# Patient Record
Sex: Male | Born: 1950 | Race: White | Hispanic: No | Marital: Married | Smoking: Former smoker
Health system: Southern US, Community
[De-identification: ages and names within clinical notes are randomized; demographics above are authoritative.]

## PROBLEM LIST (undated history)

## (undated) DIAGNOSIS — E785 Hyperlipidemia, unspecified: Secondary | ICD-10-CM

## (undated) DIAGNOSIS — I10 Essential (primary) hypertension: Secondary | ICD-10-CM

## (undated) HISTORY — DX: Essential (primary) hypertension: I10

## (undated) HISTORY — DX: Hyperlipidemia, unspecified: E78.5

## (undated) HISTORY — PX: APPENDECTOMY: SHX54

## (undated) HISTORY — PX: COLONOSCOPY: SHX174

---

## 2004-06-16 ENCOUNTER — Ambulatory Visit: Payer: Self-pay | Admitting: Neurological Surgery

## 2004-06-16 ENCOUNTER — Inpatient Hospital Stay (HOSPITAL_COMMUNITY): Admission: EM | Admit: 2004-06-16 | Discharge: 2004-06-29 | Payer: Self-pay | Admitting: Emergency Medicine

## 2004-07-07 ENCOUNTER — Ambulatory Visit (HOSPITAL_COMMUNITY): Admission: RE | Admit: 2004-07-07 | Discharge: 2004-07-07 | Payer: Self-pay | Admitting: Neurological Surgery

## 2004-07-21 ENCOUNTER — Ambulatory Visit: Payer: Self-pay | Admitting: Internal Medicine

## 2005-04-28 ENCOUNTER — Encounter: Admission: RE | Admit: 2005-04-28 | Discharge: 2005-04-28 | Payer: Self-pay | Admitting: Neurology

## 2006-06-08 ENCOUNTER — Ambulatory Visit: Payer: Self-pay | Admitting: Internal Medicine

## 2006-08-03 HISTORY — PX: LAPAROSCOPIC RETROPUBIC PROSTATECTOMY: SUR794

## 2007-12-29 ENCOUNTER — Ambulatory Visit: Payer: Self-pay | Admitting: Internal Medicine

## 2007-12-29 DIAGNOSIS — I10 Essential (primary) hypertension: Secondary | ICD-10-CM | POA: Insufficient documentation

## 2007-12-29 DIAGNOSIS — E7800 Pure hypercholesterolemia, unspecified: Secondary | ICD-10-CM | POA: Insufficient documentation

## 2007-12-29 DIAGNOSIS — E785 Hyperlipidemia, unspecified: Secondary | ICD-10-CM | POA: Insufficient documentation

## 2007-12-29 DIAGNOSIS — H9319 Tinnitus, unspecified ear: Secondary | ICD-10-CM | POA: Insufficient documentation

## 2007-12-29 DIAGNOSIS — K219 Gastro-esophageal reflux disease without esophagitis: Secondary | ICD-10-CM | POA: Insufficient documentation

## 2008-01-02 ENCOUNTER — Encounter (INDEPENDENT_AMBULATORY_CARE_PROVIDER_SITE_OTHER): Payer: Self-pay | Admitting: *Deleted

## 2008-06-13 ENCOUNTER — Encounter: Payer: Self-pay | Admitting: Internal Medicine

## 2008-08-01 ENCOUNTER — Encounter (INDEPENDENT_AMBULATORY_CARE_PROVIDER_SITE_OTHER): Payer: Self-pay | Admitting: Urology

## 2008-08-01 ENCOUNTER — Inpatient Hospital Stay (HOSPITAL_COMMUNITY): Admission: RE | Admit: 2008-08-01 | Discharge: 2008-08-02 | Payer: Self-pay | Admitting: Urology

## 2008-08-08 ENCOUNTER — Encounter: Payer: Self-pay | Admitting: Internal Medicine

## 2008-09-19 ENCOUNTER — Encounter: Payer: Self-pay | Admitting: Internal Medicine

## 2008-12-07 ENCOUNTER — Encounter: Payer: Self-pay | Admitting: Cardiology

## 2008-12-07 ENCOUNTER — Ambulatory Visit: Payer: Self-pay

## 2008-12-07 ENCOUNTER — Ambulatory Visit: Payer: Self-pay | Admitting: Cardiology

## 2008-12-13 ENCOUNTER — Telehealth (INDEPENDENT_AMBULATORY_CARE_PROVIDER_SITE_OTHER): Payer: Self-pay | Admitting: *Deleted

## 2008-12-17 LAB — CONVERTED CEMR LAB
BUN: 20 mg/dL (ref 6–23)
CO2: 26 meq/L (ref 19–32)
Calcium: 9.2 mg/dL (ref 8.4–10.5)
Chloride: 107 meq/L (ref 96–112)
Creatinine, Ser: 1 mg/dL (ref 0.4–1.5)
GFR calc non Af Amer: 82.07 mL/min (ref >60)
Glucose, Bld: 83 mg/dL (ref 70–99)
Magnesium: 2.5 mg/dL (ref 1.5–2.5)
Potassium: 3.8 meq/L (ref 3.5–5.1)
Sodium: 141 meq/L (ref 135–145)
TSH: 0.58 microintl units/mL (ref 0.35–5.50)

## 2008-12-19 DIAGNOSIS — I499 Cardiac arrhythmia, unspecified: Secondary | ICD-10-CM | POA: Insufficient documentation

## 2008-12-21 ENCOUNTER — Ambulatory Visit: Payer: Self-pay | Admitting: Cardiology

## 2009-04-26 ENCOUNTER — Encounter: Payer: Self-pay | Admitting: Internal Medicine

## 2009-05-08 ENCOUNTER — Telehealth (INDEPENDENT_AMBULATORY_CARE_PROVIDER_SITE_OTHER): Payer: Self-pay | Admitting: *Deleted

## 2009-06-26 ENCOUNTER — Ambulatory Visit: Payer: Self-pay | Admitting: Internal Medicine

## 2009-06-26 DIAGNOSIS — Z8546 Personal history of malignant neoplasm of prostate: Secondary | ICD-10-CM | POA: Insufficient documentation

## 2009-07-02 ENCOUNTER — Encounter (INDEPENDENT_AMBULATORY_CARE_PROVIDER_SITE_OTHER): Payer: Self-pay | Admitting: *Deleted

## 2009-11-22 ENCOUNTER — Encounter: Payer: Self-pay | Admitting: Internal Medicine

## 2010-01-10 ENCOUNTER — Telehealth: Payer: Self-pay | Admitting: Internal Medicine

## 2010-02-18 ENCOUNTER — Encounter: Payer: Self-pay | Admitting: Internal Medicine

## 2010-08-06 ENCOUNTER — Other Ambulatory Visit: Payer: Self-pay | Admitting: Internal Medicine

## 2010-08-06 ENCOUNTER — Ambulatory Visit
Admission: RE | Admit: 2010-08-06 | Discharge: 2010-08-06 | Payer: Self-pay | Source: Home / Self Care | Attending: Internal Medicine | Admitting: Internal Medicine

## 2010-08-06 ENCOUNTER — Encounter: Payer: Self-pay | Admitting: Internal Medicine

## 2010-08-06 LAB — CBC WITH DIFFERENTIAL/PLATELET
Basophils Absolute: 0.1 10*3/uL (ref 0.0–0.1)
Basophils Relative: 0.8 % (ref 0.0–3.0)
Eosinophils Absolute: 0.3 10*3/uL (ref 0.0–0.7)
Eosinophils Relative: 4.1 % (ref 0.0–5.0)
HCT: 40.3 % (ref 39.0–52.0)
Hemoglobin: 13.6 g/dL (ref 13.0–17.0)
Lymphocytes Relative: 27.8 % (ref 12.0–46.0)
Lymphs Abs: 1.9 10*3/uL (ref 0.7–4.0)
MCHC: 33.7 g/dL (ref 30.0–36.0)
MCV: 97.8 fl (ref 78.0–100.0)
Monocytes Absolute: 0.6 10*3/uL (ref 0.1–1.0)
Monocytes Relative: 8.4 % (ref 3.0–12.0)
Neutro Abs: 4 10*3/uL (ref 1.4–7.7)
Neutrophils Relative %: 58.9 % (ref 43.0–77.0)
Platelets: 274 10*3/uL (ref 150.0–400.0)
RBC: 4.12 Mil/uL — ABNORMAL LOW (ref 4.22–5.81)
RDW: 13.6 % (ref 11.5–14.6)
WBC: 6.8 10*3/uL (ref 4.5–10.5)

## 2010-08-06 LAB — HEPATIC FUNCTION PANEL
ALT: 59 U/L — ABNORMAL HIGH (ref 0–53)
AST: 56 U/L — ABNORMAL HIGH (ref 0–37)
Albumin: 4.3 g/dL (ref 3.5–5.2)
Alkaline Phosphatase: 50 U/L (ref 39–117)
Bilirubin, Direct: 0.1 mg/dL (ref 0.0–0.3)
Total Bilirubin: 0.8 mg/dL (ref 0.3–1.2)
Total Protein: 7.6 g/dL (ref 6.0–8.3)

## 2010-08-06 LAB — BASIC METABOLIC PANEL
BUN: 20 mg/dL (ref 6–23)
CO2: 26 mEq/L (ref 19–32)
Calcium: 9.8 mg/dL (ref 8.4–10.5)
Chloride: 107 mEq/L (ref 96–112)
Creatinine, Ser: 0.9 mg/dL (ref 0.4–1.5)
GFR: 97.1 mL/min (ref >60.00)
Glucose, Bld: 86 mg/dL (ref 70–99)
Potassium: 4.4 mEq/L (ref 3.5–5.1)
Sodium: 140 mEq/L (ref 135–145)

## 2010-08-06 LAB — TSH: TSH: 0.62 u[IU]/mL (ref 0.35–5.50)

## 2010-08-31 LAB — CONVERTED CEMR LAB
ALT: 45 units/L (ref 0–53)
ALT: 51 units/L (ref 0–53)
AST: 39 units/L — ABNORMAL HIGH (ref 0–37)
AST: 45 units/L — ABNORMAL HIGH (ref 0–37)
Albumin: 4.2 g/dL (ref 3.5–5.2)
Albumin: 4.5 g/dL (ref 3.5–5.2)
Alkaline Phosphatase: 44 units/L (ref 39–117)
Alkaline Phosphatase: 58 units/L (ref 39–117)
BUN: 23 mg/dL (ref 6–23)
BUN: 25 mg/dL — ABNORMAL HIGH (ref 6–23)
Basophils Absolute: 0 10*3/uL (ref 0.0–0.1)
Basophils Absolute: 0.1 10*3/uL (ref 0.0–0.1)
Basophils Relative: 0.8 % (ref 0.0–1.0)
Basophils Relative: 0.8 % (ref 0.0–3.0)
Bilirubin, Direct: 0.1 mg/dL (ref 0.0–0.3)
Bilirubin, Direct: 0.1 mg/dL (ref 0.0–0.3)
CO2: 29 meq/L (ref 19–32)
CO2: 29 meq/L (ref 19–32)
Calcium: 9.5 mg/dL (ref 8.4–10.5)
Calcium: 9.9 mg/dL (ref 8.4–10.5)
Chloride: 101 meq/L (ref 96–112)
Chloride: 105 meq/L (ref 96–112)
Cholesterol, target level: 200 mg/dL
Cholesterol: 253 mg/dL (ref 0–200)
Creatinine, Ser: 1 mg/dL (ref 0.4–1.5)
Creatinine, Ser: 1 mg/dL (ref 0.4–1.5)
Direct LDL: 161.2 mg/dL
Eosinophils Absolute: 0.1 10*3/uL (ref 0.0–0.7)
Eosinophils Absolute: 0.3 10*3/uL (ref 0.0–0.7)
Eosinophils Relative: 2.3 % (ref 0.0–5.0)
Eosinophils Relative: 4.5 % (ref 0.0–5.0)
Free T4: 0.7 ng/dL (ref 0.6–1.6)
GFR calc Af Amer: 100 mL/min
GFR calc non Af Amer: 81.91 mL/min (ref >60)
GFR calc non Af Amer: 82 mL/min
Glucose, Bld: 102 mg/dL — ABNORMAL HIGH (ref 70–99)
Glucose, Bld: 102 mg/dL — ABNORMAL HIGH (ref 70–99)
HCT: 39.4 % (ref 39.0–52.0)
HCT: 42.6 % (ref 39.0–52.0)
HDL goal, serum: 40 mg/dL
HDL: 70.8 mg/dL (ref >39.0)
Hemoglobin: 13.5 g/dL (ref 13.0–17.0)
Hemoglobin: 14.3 g/dL (ref 13.0–17.0)
Hgb A1c MFr Bld: 5.4 % (ref 4.6–6.5)
LDL Goal: 150 mg/dL
Lymphocytes Relative: 28.7 % (ref 12.0–46.0)
Lymphocytes Relative: 30.4 % (ref 12.0–46.0)
Lymphs Abs: 1.6 10*3/uL (ref 0.7–4.0)
MCHC: 33.7 g/dL (ref 30.0–36.0)
MCHC: 34.2 g/dL (ref 30.0–36.0)
MCV: 95.4 fL (ref 78.0–100.0)
MCV: 99.3 fL (ref 78.0–100.0)
Monocytes Absolute: 0.5 10*3/uL (ref 0.1–1.0)
Monocytes Absolute: 0.6 10*3/uL (ref 0.1–1.0)
Monocytes Relative: 8.9 % (ref 3.0–12.0)
Monocytes Relative: 9.7 % (ref 3.0–12.0)
Neutro Abs: 3.2 10*3/uL (ref 1.4–7.7)
Neutro Abs: 3.7 10*3/uL (ref 1.4–7.7)
Neutrophils Relative %: 56.8 % (ref 43.0–77.0)
Neutrophils Relative %: 57.1 % (ref 43.0–77.0)
PSA: 4.2 ng/mL — ABNORMAL HIGH (ref 0.10–4.00)
Platelets: 258 10*3/uL (ref 150.0–400.0)
Platelets: 287 10*3/uL (ref 150–400)
Potassium: 3.9 meq/L (ref 3.5–5.1)
Potassium: 4 meq/L (ref 3.5–5.1)
RBC: 3.97 M/uL — ABNORMAL LOW (ref 4.22–5.81)
RBC: 4.46 M/uL (ref 4.22–5.81)
RDW: 12.4 % (ref 11.5–14.6)
RDW: 12.4 % (ref 11.5–14.6)
Sodium: 140 meq/L (ref 135–145)
Sodium: 141 meq/L (ref 135–145)
TSH: 0.45 microintl units/mL (ref 0.35–5.50)
TSH: 0.57 microintl units/mL (ref 0.35–5.50)
Total Bilirubin: 1 mg/dL (ref 0.3–1.2)
Total Bilirubin: 1.2 mg/dL (ref 0.3–1.2)
Total CHOL/HDL Ratio: 3.6
Total Protein: 7.5 g/dL (ref 6.0–8.3)
Total Protein: 7.6 g/dL (ref 6.0–8.3)
Triglycerides: 51 mg/dL (ref 0–149)
VLDL: 10 mg/dL (ref 0–40)
WBC: 5.4 10*3/uL (ref 4.5–10.5)
WBC: 6.6 10*3/uL (ref 4.5–10.5)

## 2010-09-03 NOTE — Letter (Signed)
Summary: [facility]  [facility]   Imported By: Lanelle Bal 03/12/2010 08:16:14  _____________________________________________________________________  External Attachment:    Type:   Image     Comment:   External Document

## 2010-09-03 NOTE — Medication Information (Signed)
Summary: Nonadherence with Amlodipine Besylate/[facility]  Nonadherence with Amlodipine Besylate/[facility]   Imported By: Lanelle Bal 11/29/2009 13:07:29  _____________________________________________________________________  External Attachment:    Type:   Image     Comment:   External Document

## 2010-09-03 NOTE — Progress Notes (Signed)
Summary: FYI to dr hopper  Phone Note Call from Patient   Caller: Spouse Summary of Call: patient wife wanted dr hopper to know patient is having surgery on his shoulder [phone] Initial call taken by: Okey Regal [place],  January 10, 2010 2:06 PM  Follow-up for Phone Call        noted Follow-up by: Marga Melnick MD,  January 10, 2010 2:13 PM

## 2010-09-04 NOTE — Assessment & Plan Note (Signed)
Summary: CPX/FASTING//KN   Vital Signs:  Patient profile:   60 year old male Height:      70.5 inches Weight:      205 pounds BMI:     29.10 Temp:     98.6 degrees F oral Pulse rate:   88 / minute Resp:     14 per minute BP sitting:   150 / 96  (left arm) Cuff size:   large  Vitals Entered By: Shonna Chock CMA (August 06, 2010 9:33 AM) CC: CPX with fasting labs , Lipid Management Comments Patient's vaccines UTD/ Healthcare worker   Primary Care Provider:  Marga Melnick MD  CC:  CPX with fasting labs  and Lipid Management.  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     Hypertension Follow-Up: The patient denies lightheadedness, urinary frequency, headaches, edema, and fatigue.  The patient denies the following associated symptoms: chest pain, chest pressure, exercise intolerance, dyspnea, palpitations, and syncope.  Compliance with medications (by patient report) has been near 100%.  The patient reports that dietary compliance has been fair.  The patient reports exercising daily.  Adjunctive measures currently used by the patient include  modified salt restriction.  BP averages < 140/85. He increased  Amlopidine to 10 mg in 01/2010 because of BPs in 150s. 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     Hyperlipidemia Follow-Up:   The patient denies muscle aches, GI upset, abdominal pain, flushing, itching, constipation, and diarrhea.  Compliance with medications (by patient report) has been near 100%, but he decreased dose to 10 mg in 07/11 when CPK was up . He had torn biceps in addition to the rotator cuff  prior to enzyme check.  Adjunctive measures currently used by the patient include ASA and fish oil supplements.    Lipid Management History:      Positive NCEP/ATP III risk factors include male age 83 years old or older, current tobacco user,  and hypertension.  Negative NCEP/ATP III risk factors include non-diabetic, HDL cholesterol greater than 60, no family history for ischemic heart disease, no ASHD (atherosclerotic heart disease), no prior stroke/TIA, no peripheral vascular disease, and no history of aortic aneurysm.     Preventive Screening-Counseling & Management  Alcohol-Tobacco     Smoking Status: current  Current Medications (verified): 1)  Altace 10 Mg Caps (Ramipril) .Marland Kitchen.. 1 Cap Once Daily 2)  Hydrochlorothiazide 25 Mg  Tabs (Hydrochlorothiazide) .... 1/2 Tab Once Daily 3)  Sertraline Hcl 50 Mg  Tabs (Sertraline Hcl) .... Take One Tablet Daily 4)  Cialis 5 Mg Tabs (Tadalafil) .... As Needed 5)  Amlodipine Besylate 10 Mg Tabs (Amlodipine Besylate) .Marland Kitchen.. 1 By Mouth Once Daily 6)  Pravastatin Sodium 10 Mg Tabs (Pravastatin Sodium) .Marland Kitchen.. 1  At Bedtime  Allergies: 1)  ! 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 NMR Lipoprofile 06/2009: LDL 177( 1556/149), HDL 102, TG 54. LDL goal = < <140, ideally < 120. 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 dementia; PGF: alcoholism  Social History: Occupation: OB/GYN Married Current Smoker-cigars,rarely ( 1-2 cigars / weekend) Alcohol use-yes-socially Regular exercise-yes:CVE as treadmill  30-40 min  /day  Review of Systems  The patient denies anorexia, fever, vision loss, decreased hearing, prolonged cough, hemoptysis, melena, hematochezia, severe indigestion/heartburn, suspicious skin lesions, unusual weight change, abnormal bleeding, enlarged lymph nodes, and angioedema.         Weight down 15 # with low carb program.  Physical Exam  General:  Well-developed,well-nourished;alert,appropriate and cooperative throughout examination Head:  Normocephalic and atraumatic without obvious abnormalities.  Eyes:  No corneal or conjunctival inflammation noted.Perrla. Funduscopic exam benign, without hemorrhages, exudates or papilledema.  Ears:  External ear exam shows no significant lesions or deformities.  Otoscopic examination reveals clear canals, tympanic membranes are intact bilaterally without bulging, retraction, inflammation or discharge. Hearing is grossly normal bilaterally. Nose:  External nasal examination shows no deformity or inflammation. Nasal mucosa are pink and moist without lesions or exudates. Mouth:  Oral mucosa and oropharynx without lesions or exudates.  Teeth in good repair. Neck:  No deformities, masses, or tenderness noted. Minimal physiologic asymmetry , R > L thyroid lobe Chest Wall:  Well developed pec muscles Lungs:  Normal respiratory effort, chest expands symmetrically. Lungs are clear to auscultation, no crackles or wheezes. Heart:  Normal rate and regular rhythm. S1 and S2 normal without gallop, murmur, click, rub .Soft S4 Abdomen:  Bowel sounds positive,abdomen soft and non-tender without masses, organomegaly or hernias noted. Genitalia:  Dr Marcello Fennel Msk:  No deformity or scoliosis noted of thoracic or lumbar spine.   Pulses:  R and L carotid,radial,dorsalis pedis and posterior tibial pulses are full and equal bilaterally Extremities:  No clubbing, cyanosis, edema, or deformity noted with normal full range of motion of all joints.   Neurologic:  alert &  oriented X3, strength normal in all extremities, and DTRs symmetrical and normal.   Skin:  Intact without suspicious lesions or rashes Cervical Nodes:  No lymphadenopathy noted Axillary Nodes:  No palpable lymphadenopathy Psych:  memory intact for recent and remote, normally interactive, and good eye contact.     Impression & Recommendations:  Problem # 1:  ROUTINE GENERAL MEDICAL EXAM@HEALTH  CARE FACL (ICD-V70.0)  Orders: EKG w/ Interpretation (93000) Venipuncture (04540) TLB-BMP (Basic Metabolic Panel-BMET) (80048-METABOL) TLB-CBC Platelet - w/Differential (85025-CBCD) TLB-Hepatic/Liver Function Pnl (80076-HEPATIC) TLB-TSH (Thyroid Stimulating Hormone) (84443-TSH) T- * Misc. Laboratory test [phone])  Problem # 2:  HYPERTENSION (ICD-401.9)  His updated medication list for this problem includes:    Altace 10 Mg Caps (Ramipril) .Marland Kitchen... 1 cap once daily    Hydrochlorothiazide 25 Mg Tabs (Hydrochlorothiazide) .Marland Kitchen... 1/2 tab once daily    Amlodipine Besylate 10 Mg Tabs (Amlodipine besylate) .Marland Kitchen... 1 by mouth once daily  Problem # 3:  HYPERLIPIDEMIA (ICD-272.4)  Orders: T- * Misc. Laboratory test [phone])  Complete Medication List: 1)  Altace 10 Mg Caps (Ramipril) .Marland Kitchen.. 1 cap once daily 2)  Hydrochlorothiazide 25 Mg Tabs (Hydrochlorothiazide) .... 1/2 tab once daily 3)  Sertraline Hcl 50 Mg Tabs (Sertraline hcl) .... Take one tablet daily 4)  Cialis 5 Mg Tabs (Tadalafil) .... As needed 5)  Amlodipine Besylate 10 Mg Tabs (Amlodipine besylate) .Marland Kitchen.. 1 by mouth once daily 6)  Pravastatin Sodium 10 Mg Tabs (  pravastatin Sodium)  .Marland Kitchen.. 1  at bedtime  Lipid Assessment/Plan:      Based on NCEP/ATP III, the patient's risk factor category is "2 or more risk factors and a calculated 10 year CAD risk of > 20%".  The patient's lipid goals are as follows: Total cholesterol goal is 200; LDL cholesterol goal is 150; HDL cholesterol goal is 40; Triglyceride goal is 150.  His LDL cholesterol goal has not  been met.  Secondary causes for hyperlipidemia have been ruled out.  He has been counseled on adjunctive measures for lowering his cholesterol and has been provided with dietary instructions.    Patient Instructions: 1)  It is important that you exercise regularly at least 20 minutes 5 times a week. If you develop chest pain, have severe difficulty breathing, or feel very tired , stop exercising immediately and seek medical attention. 2)  Take an Aspirin every day. 3)  Check your Blood Pressure regularly. If it is above: 135/85 ON AVERAGE  you should  e-mail me ( [email or web address])   Orders Added: 1)  Est. Patient 40-64 years [99396] 2)  EKG w/ Interpretation [93000] 3)  Venipuncture [36415] 4)  TLB-BMP (Basic Metabolic Panel-BMET) [80048-METABOL] 5)  TLB-CBC Platelet - w/Differential [85025-CBCD] 6)  TLB-Hepatic/Liver Function Pnl [80076-HEPATIC] 7)  TLB-TSH (Thyroid Stimulating Hormone) [84443-TSH] 8)  T- * Misc. Laboratory test [phone]     Appended Document: CPX/FASTING//KN

## 2010-12-16 NOTE — Op Note (Signed)
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   POSTOPERATIVE DIAGNOSIS:  Clinically localized adenocarcinoma of  prostate (clinical stage T1c NX MX).   PROCEDURE:  Robotic-assisted laparoscopic radical prostatectomy  (bilateral nerve sparing).   SURGEON:  Heloise Purpura, MD.   ASSISTANT:  Delia Chimes, NP.   ANESTHESIA:  General.   COMPLICATIONS:  None.   ESTIMATED BLOOD LOSS:  100 mL.   INTRAVENOUS FLUIDS:  1500 mL of lactated Ringer's.   SPECIMENS:  Prostate and seminal vesicles.   DISPOSITION:  Specimen to pathology.   DRAINS:  1. A #20-French coude catheter.  2. A #19 Blake pelvic drain.   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  After a thorough discussion regarding management options for treatment,  he elected to proceed with surgical therapy and the above procedure.  The potential risks, complications, and alternative options associated  with this procedure were discussed in detail with the patient and  informed consent was obtained.   DESCRIPTION OF PROCEDURE:  The patient was taken to the operating room  and a general anesthetic was administered.  He was given preoperative  antibiotics, placed in the dorsal lithotomy position, and prepped and  draped in the usual sterile fashion.  Next, a preoperative time-out was  performed.  A 22-French Foley catheter was then attempted to be placed  via the urethra.  However, the patient did have a small urethral meatus.  Therefore, a 20-French three-way Foley catheter was used and was able to  be passed into the bladder without difficulty.  The bladder was emptied  without difficulty.  A site was then selected just inferior to the  umbilicus for placement of the camera port.  This was placed using a  standard open Hasson technique.  This allowed entry into the peritoneal  cavity under direct vision and without difficulty.  A 12 mm port was  then placed and a pneumoperitoneum established.  The 0-degree lens was  then used to inspect the abdomen and there was no evidence for any  intraabdominal injuries or other abnormalities.  The remaining ports  were then placed under direct vision.  Bilateral 8 mm robotic ports were  placed 10 cm lateral to and just inferior to the camera port site.  An  additional 8 mm robotic port was placed in the far left lateral  abdominal wall.  A 5 mm port was placed between the camera port and the  right robotic port.  An additional 12 mm port was placed in the far  right lateral abdominal wall for laparoscopic assistance.  All ports  were placed under direct vision and without difficulty.  The surgical  cart was then docked.  With the aid of the cautery scissors, the bladder  was reflected posteriorly allowing entry into the space of Retzius and  identification of the endopelvic fascia and prostate.  The endopelvic  fascia was identified after removal of the periprosthetic fat overlying  the anterior aspect of the prostate.  The endopelvic fascia was then  incised from the apex back to the base of prostate bilaterally and the  underlying levator muscle fibers were swept laterally off the prostate  thereby isolating the dorsal venous complex.  The dorsal venous complex  was then stapled and divided with a 45 mm Flex-ETS stapler.  The bladder  neck was then identified with the aid of Foley catheter manipulation was  divided anteriorly thereby exposing the Foley catheter.  The  catheter  balloon was deflated and the catheter was brought into the operative  field and used to retract the prostate anteriorly.  This exposed the  posterior bladder neck which appeared unremarkable.  The posterior  bladder neck was then divided and dissection proceeded between the  bladder and prostate until the vasa deferens and seminal vesicles were  identified.  The vasa deferens were isolated, divided and lifted  anteriorly.  The seminal vesicles were then dissected down to their tips  with care to control the seminal vesicle arterial blood supply with Hem-  o-lok clips.  Seminal vesicles were then also lifted anteriorly and the  space between Denonvilliers fascia and the anterior rectum was bluntly  developed.  This plane was noted to be somewhat adherent likely the  result the patient's multiple prostate biopsies over the course of the  past year.  The plane was able to be developed without incident.  The  lateral prostatic fascia was then incised bilaterally and the  neurovascular bundles were released.  The vascular pedicles of the  prostate were then ligated with Hem-o-lok clips above the level of the  neurovascular bundles and the neurovascular bundles were swept laterally  off the apex of the prostate and urethra.  He urethra was then sharply  transected allowing the prostate specimen to be disarticulated.  The  pelvis was then copiously irrigated and hemostasis was ensured.  There  was no evidence for a rectal injury.  Attention then turned to the  urethral anastomosis.  A 2-0 Vicryl slip-knot was placed between  Denonvilliers fascia and the posterior bladder neck and the posterior  urethra to reapproximate these structures.  A double-armed 3-0 Monocryl  suture was then used to perform a 360 degrees running tension-free  anastomosis between the bladder neck and urethra.  A new 20-French coude  catheter was inserted into the bladder and irrigated.  There were no  blood clots  within the bladder and the anastomosis appeared to be  watertight.  A #19 Blake drain was then brought through the left robotic  port and positioned appropriately within the pelvis.  It was secured to  the skin with a nylon suture.  The surgical cart was then undocked.  The  right lateral 12 mm port site was closed at the fascial level with a  zero Vicryl suture placed with the aid of the suture passer device.  All  remaining ports were removed under direct vision.  The prostate specimen  was removed intact within the Endopouch retrieval bag via the  periumbilical port site.  This fascial opening was then closed with a  running  zero Vicryl suture.  All port sites were injected with 0.25%  Marcaine and reapproximated at the skin level with staples.  Sterile  dressings were applied.  The patient appeared to tolerate the procedure  well and without complications.  He was able to be extubated and  transferred to the recovery unit in satisfactory condition.      Heloise Purpura, MD  Electronically Signed     LB/MEDQ  D:  08/01/2008  T:  08/02/2008  Job:  [id number]

## 2010-12-19 NOTE — Consult Note (Signed)
Juan, BOLLS NO.:  [id number]   MEDICAL RECORD NO.:  [id number]          PATIENT TYPE:  INP   LOCATION:  1823                         FACILITY:  [facility]   PHYSICIAN:  Stefani Dama, M.D.  DATE OF BIRTH:  Apr 12, 1951   DATE OF CONSULTATION:  06/16/2004  DATE OF DISCHARGE:                                   CONSULTATION   REASON FOR CONSULTATION:  Subarachnoid hemorrhage.   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  He was brought to the  emergency department by his wife.  He was quite cognizant.  Suspected that  he may have had a subarachnoid hemorrhage and that he had a severe onset of  headache and nuchal rigidity.  He complained of some nausea but he did not  vomit.  He was evaluated with a CT scan which showed the presence of a  diffuse subarachnoid hemorrhage in the basilar cisterns.  He is now to be  admitted to undergo cerebral angiography.   For details of the history and physical examination, consult H&P that is  dictated under separate cover.       HJE/MEDQ  D:  06/16/2004  T:  06/16/2004  Job:  [id number]

## 2010-12-19 NOTE — H&P (Signed)
[name], [name], M.D.  DATE OF BIRTH:  22-Jul-1951   DATE OF ADMISSION:  06/16/2004  DATE OF DISCHARGE:                                HISTORY & PHYSICAL   ADMISSION DIAGNOSIS:  Subarachnoid hemorrhage.   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  He was brought to the emergency room by his wife.  He  complained of severe headache and some nausea feeling but he notes that much  of the neck stiffness that he initially felt along with the nausea seems to  be abating.   Initial head CT showed that the patient had subarachnoid in the basilar  cisterns with some early signs of hydrocephalus and fullness of the  ventricular system.   PAST MEDICAL HISTORY:  Reveals that he has generally had excellent health.  He does have mild hypertension.   PAST SURGICAL HISTORY:  His only surgery has been an appendectomy.   PRIMARY CARE PHYSICIAN:  Juan Coleman. Alwyn Ren, M.D.   MEDICATIONS:  He is not taking any medications.   ALLERGIES:  He notes an a sensitivity to CODEINE but has no known medical  allergies.   SOCIAL HISTORY:  He is married.  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  Major  stressor has been recent loss of his son unexpectedly this past summer.   PHYSICAL EXAMINATION:  VITAL SIGNS:  I note that his blood pressure is  180/105.  His heart rate is 72 and regular.  His respirations are 16 and  unlabored.  HEENT:  His pupils are 4 mm, briskly reactive to light and accommodation.  Extraocular movements are full.  NEUROLOGICAL:  There is no evidence of a drift on motor strength testing.  Face is symmetric.  Tongue and uvula are in the midline. Sclerae and  conjunctivae are  clear.  Deep tendon reflexes are 2+ in the biceps and  triceps, 2+ in the patella and the Achilles.  Babinski's are downgoing.  Sensation is intact.  NECK:  Does not reveal any meningismus.  No masses.  No bruits are heard.  HEART:  Regular rate and rhythm.  No murmurs are heard.  ABDOMEN:  Soft.  Bowel sounds are positive.  No masses are noted.  EXTREMITIES:  No clubbing, cyanosis, or edema.   IMPRESSION:  The patient has evidence of a subarachnoid hemorrhage with  early hydrocephalus.   PLAN:  The patient plan is now to have him admitted.  Undergo angiography,  possible coiling.  We will start him on nimodipine to helpfully help control  his blood pressure.       HJE/MEDQ  D:  06/16/2004  T:  06/16/2004  Job:  [id number]

## 2010-12-19 NOTE — Discharge Summary (Signed)
Juan Coleman, BRANDENBURG NO.:  [id number]   MEDICAL RECORD NO.:  [id number]          PATIENT TYPE:  INP   LOCATION:  1429                         FACILITY:  [facility]   PHYSICIAN:  Heloise Purpura, MD      DATE OF BIRTH:  October 03, 1950   DATE OF ADMISSION:  08/01/2008  DATE OF DISCHARGE:  08/02/2008                               DISCHARGE SUMMARY   ADMISSION DIAGNOSIS:  Clinically localized adenocarcinoma of the  prostate.   DISCHARGE DIAGNOSIS:  Clinically localized adenocarcinoma of the  prostate.   PROCEDURES:  Robotic assisted laparoscopic radical prostatectomy.   HISTORY AND PHYSICAL:  For full details, please see admission history  and physical.  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  After discussion  regarding management options for treatment, he elected to proceed with  surgical therapy and the above procedure.   HOSPITAL COURSE:  On August 01, 2008, the patient was taken to the  operating room and underwent a robotic assisted laparoscopic radical  prostatectomy.  He tolerated this procedure well without complications.  Postoperatively, he was able to be transferred to a regular hospital  room following recovery from anesthesia.  His postoperative hematocrit  was 37.7.  He remained hemodynamically stable overnight that evening.  On postoperative day #1, his hematocrit again remained stable at 37.8.  He was able to tolerate a clear liquid diet and was able to ambulate  without difficulty.  He was transitioned to oral pain medication and had  met all discharge criteria by the afternoon of postoperative day #1, and  was discharged home in excellent condition.   DISPOSITION:  Home.   DISCHARGE MEDICATIONS:  He was instructed to resume his regular home  medications excepting any aspirin, nonsteroidal anti-inflammatory drugs,  or herbal supplements.  He was given a prescription to take Darvocet  as  needed for pain and Colace as a stool softener.   DISCHARGE INSTRUCTIONS:  He was instructed to be ambulatory but  specifically told to refrain from any heavy lifting, strenuous activity,  or driving.  He was instructed on routine Foley catheter care.   FOLLOW UP:  He will follow up in 1 week for removal of his Foley  catheter.      Heloise Purpura, MD  Electronically Signed     LB/MEDQ  D:  08/20/2008  T:  08/20/2008  Job:  [id number]

## 2010-12-19 NOTE — Consult Note (Signed)
NAMEKOHLE, WINNER NO.:  [id number]   MEDICAL RECORD NO.:  [id number]          PATIENT TYPE:  INP   LOCATION:  3111                         FACILITY:  [facility]   PHYSICIAN:  Duke Salvia, M.D.  DATE OF BIRTH:  September 21, 1950   DATE OF CONSULTATION:  06/18/2004  DATE OF DISCHARGE:                                   CONSULTATION   Thanks very much for asking me to see Dr. Richardean Chimera in consultation  because of arrhythmias in the context of a subarachnoid hemorrhage.   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   In the last 12-18 hours he has developed recurrent episodes of sinus slowing  associated with inverted P-waves.  These have been asymptomatic.  On two  occasions there have been pauses with dropped P-waves.  These, too, have  been asymptomatic.   The patient is moderately fit, though not aerobically so.   PAST MEDICAL HISTORY:  Mild hypertension but he takes no medications for  this.   ALLERGIES:  CODEINE.   CURRENT MEDICATIONS:  1.  Nimodipine.  2.  Decadron.   HOME MEDICATIONS:  Zoloft.   SOCIAL HISTORY:  He smokes a couple cigarettes a day.  He drinks a couple  glasses of wine a day.   PAST SURGICAL HISTORY:  1.  Appendectomy.  2.  Wisdom tooth extraction.   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  VITAL SIGNS:  Blood pressure 150/60, heart rate 55.  HEENT:  Normal.  NECK:  Atrial flutter.  Carotids are brisk and full bilaterally without  bruits.  BACK:  Without kyphosis, scoliosis.  LUNGS:  Clear.  HEART:  Regular without murmurs or  gallops.  ABDOMEN:  Soft without active bowel sounds.  EXTREMITIES:  Femoral pulses not examined.  Distal pulses were intact.  There is no clubbing, cyanosis, edema.  NEUROLOGIC:  Grossly normal.  He did have significant headache and was lying  there with an ice pack on his head.   Electrocardiogram dated on arrival showed a sinus rhythm at __________ with  intervals 0.196, 0.096, 0.39 with axis of 45 degrees.  The electrocardiogram  suggested prior anteroseptal myocardial infarction with Q-waves in lead V1  and V2.  I suspect that this is related to precordial lead placed.   Review of the telemetry strips are as noted above.  There is frequent  episodes of sinus slowing.  There are occasional dropped beats.  The PR  intervals for most of these episodes is similar to sinus rhythm suggesting a  low atrial focus as opposed to a junctional focus.   IMPRESSION:  1.  Subarachnoid hemorrhage, nonaneurysmal.  2.  Sinus node dysfunction in the context of #  1 with:      1.  Bradycardia.      2.  Wandering atrial focus.      3.  Inverted P-waves most consistent with an atrial focus as opposed to          a junctional rhythm.      4.  Occasional blocked atrial beats, i.e. AV block.  3.  Paroxysmal atrial fibrillation.  4.  Systolic hypertension.  5.  Negative recent Cardiolite.  6.  Abnormal electrocardiogram suggestive of a septal myocardial infarction.  7.  Nimodipine therapy.   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  In  the context of that, in the last 12-18 hours has developed sinus node  dysfunction with wandering atrial pacemaker, perhaps some junctional rhythm  and rare AV block.  As best as I can gather from perusal of the literature,  a number of observations can be made, this is extremely common, it is  unrelated to prognosis, it is associated in one study with magnesium  changes, almost universally resolves within 10-12 days.   As such, I think  the thing to do is to check his magnesium, try to associate  his changes in rate with waves of headache and nausea, observe for now,  recheck the electrocardiogram to make sure that the ECG abnormalities are,  in fact, spurious.   Thank you for the consultation.       SCK/MEDQ  D:  06/18/2004  T:  06/19/2004  Job:  [id number]

## 2010-12-19 NOTE — Discharge Summary (Signed)
NAMESHLOIMA, CLINCH NO.:  [id number]   MEDICAL RECORD NO.:  [id number]          PATIENT TYPE:  INP   LOCATION:  3103                         FACILITY:  [facility]   PHYSICIAN:  Stefani Dama, M.D.  DATE OF BIRTH:  Dec 09, 1950   DATE OF ADMISSION:  06/16/2004  DATE OF DISCHARGE:  06/29/2004                                 DISCHARGE SUMMARY   ADMITTING DIAGNOSES:  Subarachnoid hemorrhage.   DISCHARGE AND FINAL DIAGNOSES:  1.  Subarachnoid hemorrhage.  No intracranial aneurysm.  2.  Migraine headache syndrome and moderate vasospasm status post      subarachnoid hemorrhage.  3.  Electrolyte imbalance status post subarachnoid hemorrhage.  4.  Cardiac arrhythmia.   CONDITION ON DISCHARGE:  Improving.   HOSPITAL COURSE:  Juan Coleman is a 60 year old right-handed individual  who had the severe and sudden onset of headache at 9:00 on the evening of  June 16, 2004.  He presented to the emergency department and a CT scan  of the brain correctly identified a subarachnoid hemorrhage which the  patient suspected he had sustained.  He was treated in the intensive care  unit with fluid therapy and started on nimodipine at the time of admission.  He was placed on a small dose of steroid medication.  He complained of  severe headache which seemed to respond nicely to fluid treatment.  A  cerebral angiogram was performed on the evening of admission and this  demonstrated that the patient had no evidence of vasospasm and he had no  evidence of an intracranial vascular abnormality.  His extracranial carotid  circulation appeared to be completely within limits of normal.  He was  maintained on IV hydration for the first two days of his hospitalization.  He gradually was able to improve and was taking good fluid intake on his  own.  By the fourth day he was taking all oral fluids and his IV was capped.  He was transferred to the floor and by the sixth day started having some  increasing headache and by the seventh day complained of severe, unrelenting  headache not responsive to high doses of narcotic analgesia in the form of  Dilaudid.  The patient was noted to have cardiac abnormality also during his  hospitalization with junctional beats noted frequently in the heart rate  that decreased to 27 with a junctional rhythm being observed.  He was seen  in consultation by Dr. Graciela Husbands from [facility] and suggestions were  made to change his narcotic analgesics from morphine to Dilaudid.  His  cardiac rhythm seemed to improve spontaneously with those changes.  No  further causes were noted.  After being transferred to the 4700 unit the  patient was maintained on telemetry.  His headaches became worse and  subsequently it was noted that he developed an electrolyte abnormality with  a sodium decreasing to 129.  He was placed back in the intensive care unit  on the 8th post bleed day and further fluid therapy regained some control of  his headaches over a 48-hour period.  Nonetheless, during this time he  required significant pain medication in the form of Dilaudid 2 mg q.2h. in  addition to several doses of fentanyl and Percocet.  His electrolyte  abnormality gradually appeared to resolve itself and fluid was then  administered orally and IV supplementation was capped.  He was also given  oral salt 2 g up to q.i.d. to help sustain his salt intake and on now the  13th day post bleed he is ambulatory.  He is tolerating an oral diet.  He  complains of mild to modest headache.  He is completely lucid and  neurologically intact.  During the treatment of his severest of headaches he  was started on Depakote taking 500 mg b.i.d.  Topamax was added 25 mg b.i.d.  He was also given Fioricet for headache control.  At the time of discharge  he is being sent home on the following medications.   DISCHARGE MEDICATIONS:  1.  Nimotop 30 mg two tablets q.4h. for control of  vasospasm.  2.  Topamax 25 mg one b.i.d.  3.  Depakote ER 500 mg one b.i.d.  4.  Decadron 0.75 mg one q.i.d.  5.  Pepcid 20 mg one b.i.d.  6.  Dilaudid 2 mg #60 without refills for headache control.  7.  Fioricet #40 with three refills for headache control in addition to      Robaxin for back pain.   Electrolyte study will be performed on Wednesday to check his electrolyte  status.  The patient will undergo an outpatient cerebral angiogram  approximately a week from now to see if there is any evidence of any  vascular abnormality that may have been missed on his initial studies.  It  should be noted additionally he had MRA which was performed on the 7th post  bleed day which demonstrated basilar vasospasm.      Henr   HJE/MEDQ  D:  06/29/2004  T:  06/29/2004  Job:  [id number]   cc:   Duke Salvia, M.D.

## 2011-01-22 ENCOUNTER — Telehealth: Payer: Self-pay

## 2011-01-22 MED ORDER — PRAVASTATIN SODIUM 10 MG PO TABS: 10.0000 mg | ORAL_TABLET | Freq: Every day | ORAL | Status: DC

## 2011-01-22 MED ORDER — HYDROCHLOROTHIAZIDE 25 MG PO TABS: 25.0000 mg | ORAL_TABLET | Freq: Every day | ORAL | Status: DC

## 2011-01-22 NOTE — Telephone Encounter (Signed)
Patient aware per Dr.Hopper he needs to have potassium checked, Dr.Hopper is very cautious with patient's taking HCTZ at 25mg , Mr./Dr.Sannes ok'd understanding and indicated he will get potassium checked

## 2011-05-05 ENCOUNTER — Other Ambulatory Visit: Payer: Self-pay | Admitting: Internal Medicine

## 2011-05-08 LAB — CBC
HCT: 39.9 % (ref 39.0–52.0)
MCV: 96.8 fL (ref 78.0–100.0)
Platelets: 284 10*3/uL (ref 150–400)
RDW: 13.2 % (ref 11.5–15.5)
WBC: 6.7 10*3/uL (ref 4.0–10.5)

## 2011-05-08 LAB — BASIC METABOLIC PANEL
BUN: 13 mg/dL (ref 6–23)
Chloride: 107 mEq/L (ref 96–112)
Creatinine, Ser: 0.82 mg/dL (ref 0.4–1.5)
GFR calc non Af Amer: >60 mL/min (ref >60)
Glucose, Bld: 129 mg/dL — ABNORMAL HIGH (ref 70–99)
Potassium: 3.7 mEq/L (ref 3.5–5.1)

## 2011-05-08 LAB — ABO/RH: ABO/RH(D): O POS

## 2011-05-08 LAB — HEMOGLOBIN AND HEMATOCRIT, BLOOD
HCT: 37.8 % — ABNORMAL LOW (ref 39.0–52.0)
Hemoglobin: 12.8 g/dL — ABNORMAL LOW (ref 13.0–17.0)

## 2011-05-08 LAB — TYPE AND SCREEN: ABO/RH(D): O POS

## 2011-10-27 ENCOUNTER — Other Ambulatory Visit: Payer: Self-pay | Admitting: Internal Medicine

## 2011-10-27 MED ORDER — PRAVASTATIN SODIUM 10 MG PO TABS: 10.0000 mg | ORAL_TABLET | Freq: Every day | ORAL | Status: DC

## 2011-10-27 NOTE — Telephone Encounter (Signed)
Refill for  Pravastatin sodium 10mg  tab Qty 90 Take 1-tablet every day  Last filled 2..27.13

## 2011-10-27 NOTE — Telephone Encounter (Signed)
Patient will need to schedule a CPX prior to next refill  

## 2012-03-01 ENCOUNTER — Other Ambulatory Visit: Payer: Self-pay | Admitting: Internal Medicine

## 2012-03-01 NOTE — Telephone Encounter (Signed)
Lipid/Hep 272.4/995.20  

## 2012-03-11 ENCOUNTER — Telehealth: Payer: Self-pay | Admitting: Internal Medicine

## 2012-03-11 MED ORDER — HYDROCHLOROTHIAZIDE 25 MG PO TABS: 25.0000 mg | ORAL_TABLET | Freq: Every day | ORAL | Status: DC

## 2012-03-11 NOTE — Telephone Encounter (Signed)
rx sent

## 2012-03-11 NOTE — Telephone Encounter (Signed)
Refill: Hydrochlorothiazide 25 mg tab. Take 1 tablet every day. Qty 90. Last fill 01-12-12

## 2012-04-01 ENCOUNTER — Other Ambulatory Visit: Payer: Self-pay | Admitting: Internal Medicine

## 2012-04-01 NOTE — Telephone Encounter (Signed)
Pt has future appt, refill done.

## 2012-05-17 ENCOUNTER — Other Ambulatory Visit: Payer: Self-pay | Admitting: Internal Medicine

## 2012-05-20 ENCOUNTER — Other Ambulatory Visit: Payer: Self-pay | Admitting: Internal Medicine

## 2012-05-20 NOTE — Telephone Encounter (Signed)
Needs OV w/ PCP

## 2012-05-20 NOTE — Telephone Encounter (Signed)
Request for Pravachol 10 mg -- No lipid since 2012 and patient has not been seen in over 18 mos, he does have a pending apt 05/31/12 with [facility]. Please advise if this refill is appropriate. Thank You     Soledad Gerlach

## 2012-05-31 ENCOUNTER — Encounter: Payer: Self-pay | Admitting: Internal Medicine

## 2012-05-31 ENCOUNTER — Ambulatory Visit (INDEPENDENT_AMBULATORY_CARE_PROVIDER_SITE_OTHER): Payer: PRIVATE HEALTH INSURANCE | Admitting: Internal Medicine

## 2012-05-31 VITALS — BP 144/98 | HR 63 | Temp 98.5°F | Resp 14 | Ht 71.5 in | Wt 216.0 lb

## 2012-05-31 DIAGNOSIS — Z0000 Encounter for general adult medical examination without abnormal findings: Secondary | ICD-10-CM

## 2012-05-31 DIAGNOSIS — E785 Hyperlipidemia, unspecified: Secondary | ICD-10-CM

## 2012-05-31 DIAGNOSIS — I10 Essential (primary) hypertension: Secondary | ICD-10-CM

## 2012-05-31 DIAGNOSIS — I629 Nontraumatic intracranial hemorrhage, unspecified: Secondary | ICD-10-CM | POA: Insufficient documentation

## 2012-05-31 MED ORDER — AMLODIPINE BESYLATE 10 MG PO TABS: 10.0000 mg | ORAL_TABLET | Freq: Every day | ORAL | Status: DC

## 2012-05-31 MED ORDER — SERTRALINE HCL 50 MG PO TABS: ORAL_TABLET | ORAL | Status: DC

## 2012-05-31 MED ORDER — RAMIPRIL 10 MG PO CAPS: 10.0000 mg | ORAL_CAPSULE | Freq: Every day | ORAL | Status: DC

## 2012-05-31 MED ORDER — HYDROCHLOROTHIAZIDE 25 MG PO TABS: ORAL_TABLET | ORAL | Status: DC

## 2012-05-31 MED ORDER — PRAVASTATIN SODIUM 10 MG PO TABS: ORAL_TABLET | ORAL | Status: DC

## 2012-05-31 NOTE — Progress Notes (Signed)
  Subjective:    Patient ID: Juan Coleman, male    DOB: 02-05-1951, 61 y.o.   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      Review of Systems  Blood pressure averages in the 120s over 80s; rarely it'll be in the 130s systolically. He denies significant headache, epistaxis, chest pain, palpitations, exertional dyspnea, edema, or claudication.  He feels that his diet is good; he exercises 48 minutes 5-6 times per week at a high level without symptoms.     Objective:   Physical Exam  Gen.:  well-nourished in appearance. Alert, appropriate and cooperative throughout exam. Head: Normocephalic without obvious abnormalities  Eyes: No corneal or conjunctival inflammation noted. Pupils equal round reactive to light and accommodation. Fundal exam is benign without hemorrhages, exudate, papilledema. Extraocular motion intact. Vision grossly normal with lenses. Ears: External  ear exam reveals no significant lesions or deformities. Canals clear .TMs normal. Hearing is grossly normal bilaterally. Nose: External nasal exam reveals no deformity or inflammation. Nasal mucosa are pink and moist. No lesions or exudates noted. Mouth: Oral mucosa and oropharynx reveal no lesions or exudates. Teeth in good repair. Neck: No deformities, masses, or tenderness noted. Range of motion & Thyroid normal. Lungs: Normal respiratory effort; chest expands symmetrically. Lungs are clear to auscultation without rales, wheezes, or increased work of breathing. Heart: Normal rate and rhythm. Normal S1 and S2. No gallop, click, or rub. S4 w/o  murmur. Abdomen: Bowel sounds normal; abdomen soft and nontender. No masses, organomegaly or hernias noted. Genitalia: Dr Marcello Fennel                                                                          Musculoskeletal/extremities: No deformity or scoliosis noted of  the thoracic or lumbar spine. No clubbing, cyanosis, edema, or deformity  noted. Range of motion  normal .Tone & strength  normal.Joints normal. Nail health  Good. Minor crepitus of knees Vascular: Carotid, radial artery, dorsalis pedis and  posterior tibial pulses are full and equal. No bruits present. Neurologic: Alert and oriented x3. Deep tendon reflexes symmetrical and normal.          Skin: Intact without suspicious lesions or rashes. Lymph: No cervical, axillary lymphadenopathy present. Psych: Mood and affect are normal. Normally interactive                                                                                       Assessment & Plan:  #1 comprehensive physical exam; no acute findings  #2 need for verification of home  blood pressure averages needed  Plan: see Orders

## 2012-05-31 NOTE — Patient Instructions (Addendum)
Blood Pressure Goal  Ideally is an AVERAGE < 135/85. This AVERAGE should be calculated from @ least 5-7 BP readings taken @ different times of day on different days of week. You should not respond to isolated BP readings , but rather the AVERAGE for that week. Please  schedule fasting Labs : BMET,Lipids, hepatic panel, CBC & dif, TSH. Codes: V70.0.  If you activate My Chart; the results can be released to you as soon as they populate from the lab. If you choose not to use this program; the labs have to be reviewed, copied & mailed causing a delay in getting the results to you.

## 2012-11-14 ENCOUNTER — Encounter: Payer: Self-pay | Admitting: Internal Medicine

## 2012-11-14 ENCOUNTER — Ambulatory Visit (INDEPENDENT_AMBULATORY_CARE_PROVIDER_SITE_OTHER): Payer: BC Managed Care – PPO | Admitting: Internal Medicine

## 2012-11-14 VITALS — BP 128/94 | HR 62 | Wt 224.0 lb

## 2012-11-14 DIAGNOSIS — F438 Other reactions to severe stress: Secondary | ICD-10-CM

## 2012-11-14 DIAGNOSIS — F4329 Adjustment disorder with other symptoms: Secondary | ICD-10-CM

## 2012-11-14 DIAGNOSIS — F4389 Other reactions to severe stress: Secondary | ICD-10-CM

## 2012-11-14 MED ORDER — CLONAZEPAM 0.5 MG PO TABS: 0.5000 mg | ORAL_TABLET | Freq: Two times a day (BID) | ORAL | Status: DC | PRN

## 2012-11-14 MED ORDER — SERTRALINE HCL 50 MG PO TABS: ORAL_TABLET | ORAL | Status: DC

## 2012-11-14 NOTE — Patient Instructions (Signed)
Please consider taking the Clonazepam every 8-12 hours as needed only. Sertraline 50 mg 1-1/2 pills daily is recommended.

## 2012-11-14 NOTE — Progress Notes (Signed)
  Subjective:    Patient ID: Juan Coleman, male    DOB: 1950-11-27, 62 y.o.   MRN: [id number]  HPI  He & his wife are facing major life decision; they've decided to downsize and sell their home.  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 There have been major issues with that result.  He finds himself awakening from sleep worrying about all these issues. He has not had any true panic attack.      Review of Systems  He denies a constellation of headache, flushing, chest pain, diarrhea.     Objective:   Physical Exam  He is alert, oriented, and open and forthright        Assessment & Plan:  #1 exogenous stress with adjustment issues  Plan: The pathophysiology of Neurotransmitter deficiency was reviewed. We discussed various options. It was agreed that Sertraline would be increased to 50 mg one half pills daily. Clonazepam 0.5 to be used 1 every 12 hours as needed.  Other stress management tools were discussed such as exercise.  He has a very strong spiritual base; referral to a psychologist is not clinically indicated.

## 2012-12-21 ENCOUNTER — Telehealth: Payer: Self-pay | Admitting: *Deleted

## 2012-12-21 NOTE — Telephone Encounter (Signed)
Called initiated PA awaiting fax form.

## 2012-12-22 NOTE — Telephone Encounter (Signed)
Form received and given to hopp to clarify Dx

## 2012-12-23 NOTE — Telephone Encounter (Signed)
PA completed and faxed back awaiting response. 

## 2012-12-28 NOTE — Telephone Encounter (Signed)
Approved 12-20-12 through 12-20-13, approval letter scan to chart, pharmacy faxed

## 2013-02-28 ENCOUNTER — Other Ambulatory Visit: Payer: Self-pay | Admitting: Internal Medicine

## 2013-03-01 NOTE — Telephone Encounter (Signed)
Lipid/Hep/CK 272.4/995.20

## 2013-03-22 ENCOUNTER — Other Ambulatory Visit: Payer: Self-pay | Admitting: Internal Medicine

## 2013-03-23 NOTE — Telephone Encounter (Signed)
N/A @ (2:45pm).  Pt needs to sign contrast and urine for UDS.//AB/CMA

## 2013-03-23 NOTE — Telephone Encounter (Signed)
N/A @ (11:34am)//AB/CMA

## 2013-03-24 NOTE — Telephone Encounter (Signed)
Tried to call patient on home phone- unable to leave message.

## 2013-03-26 ENCOUNTER — Other Ambulatory Visit: Payer: Self-pay | Admitting: Internal Medicine

## 2013-03-28 ENCOUNTER — Telehealth: Payer: Self-pay | Admitting: General Practice

## 2013-03-28 DIAGNOSIS — F4329 Adjustment disorder with other symptoms: Secondary | ICD-10-CM

## 2013-03-28 MED ORDER — SERTRALINE HCL 50 MG PO TABS: ORAL_TABLET | ORAL | Status: DC

## 2013-03-28 MED ORDER — CLONAZEPAM 0.5 MG PO TABS: 0.5000 mg | ORAL_TABLET | Freq: Two times a day (BID) | ORAL | Status: DC | PRN

## 2013-03-28 NOTE — Telephone Encounter (Signed)
Refill both x1 

## 2013-03-28 NOTE — Telephone Encounter (Signed)
Pt's wife called stating that her husband needs refills of clonazepam and sertraline. [place] and [facility] [address]   Last OV 11-14-12 Meds last filled on same date.

## 2013-04-19 ENCOUNTER — Other Ambulatory Visit: Payer: Self-pay | Admitting: Internal Medicine

## 2013-04-26 NOTE — Telephone Encounter (Signed)
Med filled.  

## 2013-07-05 ENCOUNTER — Other Ambulatory Visit: Payer: Self-pay | Admitting: *Deleted

## 2013-07-05 ENCOUNTER — Telehealth: Payer: Self-pay | Admitting: Internal Medicine

## 2013-07-05 DIAGNOSIS — F4329 Adjustment disorder with other symptoms: Secondary | ICD-10-CM

## 2013-07-05 MED ORDER — SERTRALINE HCL 50 MG PO TABS: ORAL_TABLET | ORAL | Status: DC

## 2013-07-05 NOTE — Telephone Encounter (Signed)
Sertraline refilled and sent electronically to pharmacy. JG//CMA

## 2013-07-05 NOTE — Telephone Encounter (Signed)
Patient called and requested a refill for sertraline (ZOLOFT) 50 MG tablet. Patient does have an apt for a cpe on 08/21/2012.    Pharmacy [id number] - [place], [place] - [address]

## 2013-07-21 ENCOUNTER — Other Ambulatory Visit: Payer: Self-pay | Admitting: *Deleted

## 2013-07-21 MED ORDER — PRAVASTATIN SODIUM 10 MG PO TABS: ORAL_TABLET | ORAL | Status: DC

## 2013-08-03 HISTORY — PX: UPPER GI ENDOSCOPY: SHX6162

## 2013-08-14 ENCOUNTER — Other Ambulatory Visit: Payer: Self-pay | Admitting: Internal Medicine

## 2013-08-14 ENCOUNTER — Ambulatory Visit (INDEPENDENT_AMBULATORY_CARE_PROVIDER_SITE_OTHER)
Admission: RE | Admit: 2013-08-14 | Discharge: 2013-08-14 | Disposition: A | Discharge status: Home / Self Care | Payer: BC Managed Care – PPO | Source: Ambulatory Visit | Attending: Internal Medicine | Admitting: Internal Medicine

## 2013-08-14 DIAGNOSIS — R05 Cough: Secondary | ICD-10-CM

## 2013-08-14 DIAGNOSIS — R059 Cough, unspecified: Secondary | ICD-10-CM

## 2013-08-14 DIAGNOSIS — I10 Essential (primary) hypertension: Secondary | ICD-10-CM

## 2013-08-14 MED ORDER — LOSARTAN POTASSIUM 100 MG PO TABS: 100.0000 mg | ORAL_TABLET | Freq: Every day | ORAL | Status: DC

## 2013-08-16 ENCOUNTER — Encounter: Payer: Self-pay | Admitting: *Deleted

## 2013-08-17 ENCOUNTER — Telehealth: Payer: Self-pay

## 2013-08-17 NOTE — Telephone Encounter (Signed)
Medication List and allergies:  Reviewed and updated  90 day supply/mail order: na [facility] [address]  Immunizations due: UTD  A/P:   No changes to FH, PSH or Personal Hx Flu vaccine--05/2013 Tdap--08/2011 Shingles--07/2013 CCS--scheduled for March PSA--06/2012--<0.01 Sees Dr Marcello Fennelannebaum We have his lab report  To Discuss with Provider: Not at this time

## 2013-08-21 ENCOUNTER — Ambulatory Visit (INDEPENDENT_AMBULATORY_CARE_PROVIDER_SITE_OTHER): Payer: BC Managed Care – PPO | Admitting: Internal Medicine

## 2013-08-21 ENCOUNTER — Encounter: Payer: Self-pay | Admitting: Internal Medicine

## 2013-08-21 VITALS — BP 153/76 | HR 51 | Temp 98.3°F | Ht 71.0 in | Wt 219.8 lb

## 2013-08-21 DIAGNOSIS — E785 Hyperlipidemia, unspecified: Secondary | ICD-10-CM

## 2013-08-21 DIAGNOSIS — R05 Cough: Secondary | ICD-10-CM

## 2013-08-21 DIAGNOSIS — R059 Cough, unspecified: Secondary | ICD-10-CM

## 2013-08-21 DIAGNOSIS — Z0000 Encounter for general adult medical examination without abnormal findings: Secondary | ICD-10-CM

## 2013-08-21 LAB — CBC WITH DIFFERENTIAL/PLATELET
BASOS ABS: 0.1 10*3/uL (ref 0.0–0.1)
Basophils Relative: 1.9 % (ref 0.0–3.0)
Eosinophils Absolute: 0.1 10*3/uL (ref 0.0–0.7)
Eosinophils Relative: 1.6 % (ref 0.0–5.0)
HCT: 42.5 % (ref 39.0–52.0)
Hemoglobin: 14.3 g/dL (ref 13.0–17.0)
LYMPHS PCT: 29.3 % (ref 12.0–46.0)
Lymphs Abs: 2.2 10*3/uL (ref 0.7–4.0)
MCHC: 33.7 g/dL (ref 30.0–36.0)
MCV: 96.1 fl (ref 78.0–100.0)
MONOS PCT: 10.8 % (ref 3.0–12.0)
Monocytes Absolute: 0.8 10*3/uL (ref 0.1–1.0)
NEUTROS PCT: 56.4 % (ref 43.0–77.0)
Neutro Abs: 4.2 10*3/uL (ref 1.4–7.7)
PLATELETS: 278 10*3/uL (ref 150.0–400.0)
RBC: 4.43 Mil/uL (ref 4.22–5.81)
RDW: 13.4 % (ref 11.5–14.6)
WBC: 7.4 10*3/uL (ref 4.5–10.5)

## 2013-08-21 LAB — HEPATIC FUNCTION PANEL
ALK PHOS: 58 U/L (ref 39–117)
ALT: 88 U/L — ABNORMAL HIGH (ref 0–53)
AST: 61 U/L — AB (ref 0–37)
Albumin: 4.5 g/dL (ref 3.5–5.2)
BILIRUBIN TOTAL: 1 mg/dL (ref 0.3–1.2)
Bilirubin, Direct: 0.1 mg/dL (ref 0.0–0.3)
Total Protein: 8.3 g/dL (ref 6.0–8.3)

## 2013-08-21 LAB — LIPID PANEL
Cholesterol: 200 mg/dL (ref 0–200)
HDL: 86.5 mg/dL (ref >39.00)
LDL CALC: 102 mg/dL — AB (ref 0–99)
Total CHOL/HDL Ratio: 2
Triglycerides: 60 mg/dL (ref 0.0–149.0)
VLDL: 12 mg/dL (ref 0.0–40.0)

## 2013-08-21 LAB — BASIC METABOLIC PANEL
BUN: 14 mg/dL (ref 6–23)
CALCIUM: 9.5 mg/dL (ref 8.4–10.5)
CO2: 24 mEq/L (ref 19–32)
CREATININE: 0.9 mg/dL (ref 0.4–1.5)
Chloride: 103 mEq/L (ref 96–112)
GFR: 90.03 mL/min (ref >60.00)
GLUCOSE: 101 mg/dL — AB (ref 70–99)
Potassium: 3.8 mEq/L (ref 3.5–5.1)
SODIUM: 137 meq/L (ref 135–145)

## 2013-08-21 LAB — TSH: TSH: 0.67 u[IU]/mL (ref 0.35–5.50)

## 2013-08-21 MED ORDER — OMEPRAZOLE 20 MG PO CPDR: DELAYED_RELEASE_CAPSULE | ORAL | Status: DC

## 2013-08-21 NOTE — Progress Notes (Signed)
Pre visit review using our clinic review tool, if applicable. No additional management support is needed unless otherwise documented below in the visit note. 

## 2013-08-21 NOTE — Progress Notes (Signed)
   Subjective:    Patient ID: Juan Coleman, male    DOB: 09-15-1950, 63 y.o.   MRN: [id number]  HPI  He is here for a physical;acute issues include a cough for 1 year.     Review of Systems He has had the cough most mornings for approximately 15 minutes for the last year. He questions whether it is related to anxiety as it is not as bad if he is not going to work. He did take a PPI for 3 weeks with no significant benefit. His gastroenterologist does plan endoscopy later this year. Reflux is not a significant issue but has been occurring occasionally at night. He denies extrinsic symptoms of itchy, watery eyes, sneezing. He does not appreciate any significant postnasal drainage. There is no associated shortness of breath or wheezing. His recent chest x-ray was negative. He was switched from an ACE inhibitor to an ARB in attempt to resolve the cough.     Objective:   Physical Exam Gen.: Healthy and well-nourished in appearance. Alert, appropriate and cooperative throughout exam. Appears younger than stated age  Head: Normocephalic without obvious abnormalities; goatee  Eyes: No corneal or conjunctival inflammation noted. Pupils equal round reactive to light and accommodation. Extraocular motion intact. Ears: External  ear exam reveals no significant lesions or deformities. Canals clear .TMs normal. Hearing is grossly normal bilaterally. Nose: External nasal exam reveals no deformity or inflammation. Nasal mucosa are pink and moist. No lesions or exudates noted.   Mouth: Oral mucosa and oropharynx reveal no lesions or exudates. Teeth in good repair. Neck: No deformities, masses, or tenderness noted. Range of motion & Thyroid normal. Lungs: Normal respiratory effort; chest expands symmetrically. Lungs are clear to auscultation without rales, wheezes, or increased work of breathing. Heart: Normal rate and rhythm. Normal S1 and S2. No gallop, click, or rub. S4 w/o murmur. Abdomen:  Bowel sounds normal; abdomen soft and nontender. No masses, organomegaly or hernias noted. Genitalia: Genitalia  as per Dr Marcello Fennelannebaum                                 Musculoskeletal/extremities: No deformity or scoliosis noted of  the thoracic or lumbar spine.  No clubbing, cyanosis, edema, or significant extremity  deformity noted. Range of motion normal .Tone & strength normal. Hand joints normal . Fingernail  health good. Able to lie down & sit up w/o help. Negative SLR bilaterally Vascular: Carotid, radial artery, dorsalis pedis and  posterior tibial pulses are full and equal. No bruits present. Neurologic: Alert and oriented x3. Deep tendon reflexes symmetrical and normal.        Skin: Intact without suspicious lesions or rashes. Lymph: No cervical, axillary lymphadenopathy present. Psych: Mood and affect are normal. Normally interactive                                                                                       Assessment & Plan:  #1 comprehensive physical exam; no acute findings #2 cough probably from ERD  Plan: see Orders  & Recommendations

## 2013-08-21 NOTE — Patient Instructions (Signed)
Reflux of gastric acid may be asymptomatic as this may occur mainly during sleep.The triggers for reflux  include stress; the "aspirin family" ; alcohol; peppermint; and caffeine (coffee, tea, cola, and chocolate). The aspirin family would include aspirin and the nonsteroidal agents such as ibuprofen &  Naproxen. Tylenol would not cause reflux. If having symptoms ; food & drink should be avoided for @ least 2 hours before going to bed.  Omeprazole 20 mg recommended X 8 weeks  30 minutes pre breakfast & 30 min pre eve meal.

## 2013-08-26 ENCOUNTER — Other Ambulatory Visit: Payer: Self-pay | Admitting: Internal Medicine

## 2013-08-26 DIAGNOSIS — R740 Nonspecific elevation of levels of transaminase and lactic acid dehydrogenase [LDH]: Principal | ICD-10-CM

## 2013-08-26 DIAGNOSIS — R7401 Elevation of levels of liver transaminase levels: Secondary | ICD-10-CM

## 2013-08-28 NOTE — Telephone Encounter (Signed)
Pravastatin refilled per protocol. JG//CMA 

## 2013-08-29 ENCOUNTER — Encounter: Payer: Self-pay | Admitting: *Deleted

## 2013-08-30 ENCOUNTER — Telehealth: Payer: Self-pay | Admitting: *Deleted

## 2013-08-30 DIAGNOSIS — R7309 Other abnormal glucose: Secondary | ICD-10-CM

## 2013-08-30 NOTE — Telephone Encounter (Signed)
Message copied by Verdie ShireBAYNES, Dequarius Jeffries M on Wed Aug 30, 2013  8:54 AM ------      Message from: Silvio PateHOMPSON, SHEENA D      Created: Tue Aug 29, 2013  9:09 AM        This patient will ave to come back in for the A1c .Marland Kitchen. The blood has already been discarded .                                       Sheena      ----- Message -----         From: Pecola LawlessWilliam F Hopper, MD         Sent: 08/25/2013   6:18 PM           To: Vance GatherSheena D Thompson            Please add A1c (790.29)             ------

## 2013-08-30 NOTE — Telephone Encounter (Signed)
Future lab ordered and sent.//AB/CMA 

## 2013-10-27 ENCOUNTER — Encounter: Payer: Self-pay | Admitting: Internal Medicine

## 2013-11-10 ENCOUNTER — Telehealth: Payer: Self-pay | Admitting: Internal Medicine

## 2013-11-10 NOTE — Telephone Encounter (Signed)
Pt called today not realizing [facility].  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  Please advise.

## 2013-11-13 ENCOUNTER — Other Ambulatory Visit: Payer: Self-pay | Admitting: Internal Medicine

## 2013-11-13 DIAGNOSIS — F4329 Adjustment disorder with other symptoms: Secondary | ICD-10-CM

## 2013-11-13 MED ORDER — SERTRALINE HCL 50 MG PO TABS: 50.0000 mg | ORAL_TABLET | Freq: Every day | ORAL | Status: DC

## 2013-11-13 MED ORDER — CLONAZEPAM 0.5 MG PO TABS: 0.5000 mg | ORAL_TABLET | Freq: Two times a day (BID) | ORAL | Status: DC | PRN

## 2013-11-13 NOTE — Telephone Encounter (Signed)
Requesting Sertraline 50mg -Take 1 tablet by mouth every day,Klonopin 0.5mg -Take 1 tablet by mouth 2 times daily as needed for anxiety. Last refill:07-05-13;#135,0,03-28-13;#60,0 Last OV:08-21-13 No UDS done. Please advise.//AB/CMA

## 2013-11-20 ENCOUNTER — Other Ambulatory Visit: Payer: Self-pay | Admitting: Internal Medicine

## 2013-11-21 ENCOUNTER — Other Ambulatory Visit: Payer: Self-pay

## 2013-11-21 ENCOUNTER — Other Ambulatory Visit: Payer: Self-pay | Admitting: Internal Medicine

## 2013-11-21 DIAGNOSIS — F4329 Adjustment disorder with other symptoms: Secondary | ICD-10-CM

## 2013-11-21 MED ORDER — CLONAZEPAM 0.5 MG PO TABS: 0.5000 mg | ORAL_TABLET | Freq: Two times a day (BID) | ORAL | Status: DC | PRN

## 2013-11-21 NOTE — Telephone Encounter (Signed)
OK #60; Rx2

## 2013-11-21 NOTE — Telephone Encounter (Signed)
Last ov 08/21/2013  Med last filled 11/13/13 #60

## 2014-01-10 ENCOUNTER — Other Ambulatory Visit: Payer: Self-pay

## 2014-01-10 MED ORDER — SERTRALINE HCL 50 MG PO TABS: 50.0000 mg | ORAL_TABLET | Freq: Every day | ORAL | Status: DC

## 2014-01-10 NOTE — Telephone Encounter (Signed)
90

## 2014-01-16 ENCOUNTER — Other Ambulatory Visit: Payer: Self-pay

## 2014-01-16 MED ORDER — SERTRALINE HCL 50 MG PO TABS: 50.0000 mg | ORAL_TABLET | Freq: Every day | ORAL | Status: DC

## 2014-01-16 NOTE — Telephone Encounter (Signed)
Patient called and states he takes 1 1/2 pills daily

## 2014-01-16 NOTE — Telephone Encounter (Signed)
#  90 OK 

## 2014-01-18 ENCOUNTER — Other Ambulatory Visit: Payer: Self-pay

## 2014-01-18 MED ORDER — SERTRALINE HCL 50 MG PO TABS: ORAL_TABLET | ORAL | Status: DC

## 2014-02-14 ENCOUNTER — Other Ambulatory Visit: Payer: Self-pay

## 2014-02-14 MED ORDER — LOSARTAN POTASSIUM 100 MG PO TABS: 100.0000 mg | ORAL_TABLET | Freq: Every day | ORAL | Status: DC

## 2014-03-31 ENCOUNTER — Other Ambulatory Visit: Payer: Self-pay | Admitting: Internal Medicine

## 2014-04-13 ENCOUNTER — Other Ambulatory Visit (HOSPITAL_COMMUNITY): Payer: Self-pay | Admitting: Gastroenterology

## 2014-04-13 ENCOUNTER — Ambulatory Visit (HOSPITAL_COMMUNITY)
Admission: RE | Admit: 2014-04-13 | Discharge: 2014-04-13 | Disposition: A | Discharge status: Home / Self Care | Payer: BC Managed Care – PPO | Source: Ambulatory Visit | Attending: Gastroenterology | Admitting: Gastroenterology

## 2014-04-13 ENCOUNTER — Other Ambulatory Visit: Payer: Self-pay | Admitting: Gastroenterology

## 2014-04-13 DIAGNOSIS — R748 Abnormal levels of other serum enzymes: Secondary | ICD-10-CM

## 2014-04-13 DIAGNOSIS — R7989 Other specified abnormal findings of blood chemistry: Secondary | ICD-10-CM | POA: Insufficient documentation

## 2014-04-17 ENCOUNTER — Other Ambulatory Visit (HOSPITAL_COMMUNITY): Payer: Self-pay | Admitting: Gastroenterology

## 2014-04-17 ENCOUNTER — Other Ambulatory Visit: Payer: Self-pay | Admitting: Gastroenterology

## 2014-04-17 DIAGNOSIS — R1011 Right upper quadrant pain: Secondary | ICD-10-CM

## 2014-04-17 DIAGNOSIS — R112 Nausea with vomiting, unspecified: Secondary | ICD-10-CM

## 2014-04-17 DIAGNOSIS — R1013 Epigastric pain: Secondary | ICD-10-CM

## 2014-04-23 ENCOUNTER — Other Ambulatory Visit: Payer: Self-pay | Admitting: Gastroenterology

## 2014-04-24 ENCOUNTER — Ambulatory Visit
Admission: RE | Admit: 2014-04-24 | Discharge: 2014-04-24 | Disposition: A | Discharge status: Home / Self Care | Payer: BC Managed Care – PPO | Source: Ambulatory Visit | Attending: Gastroenterology | Admitting: Gastroenterology

## 2014-04-24 DIAGNOSIS — R112 Nausea with vomiting, unspecified: Secondary | ICD-10-CM

## 2014-04-24 DIAGNOSIS — R1013 Epigastric pain: Secondary | ICD-10-CM

## 2014-04-24 MED ORDER — IOHEXOL 300 MG/ML  SOLN
100.0000 mL | Freq: Once | INTRAMUSCULAR | Status: AC | PRN
  Administered 2014-04-24: 100 mL via INTRAVENOUS

## 2014-04-25 ENCOUNTER — Other Ambulatory Visit: Payer: Self-pay

## 2014-04-25 NOTE — Telephone Encounter (Signed)
OK X 3 mos 

## 2014-04-26 ENCOUNTER — Encounter (HOSPITAL_COMMUNITY)
Admission: RE | Admit: 2014-04-26 | Discharge: 2014-04-26 | Disposition: A | Discharge status: Home / Self Care | Payer: BC Managed Care – PPO | Source: Ambulatory Visit | Attending: Diagnostic Radiology | Admitting: Diagnostic Radiology

## 2014-04-26 DIAGNOSIS — R112 Nausea with vomiting, unspecified: Secondary | ICD-10-CM | POA: Insufficient documentation

## 2014-04-26 DIAGNOSIS — R1011 Right upper quadrant pain: Secondary | ICD-10-CM | POA: Diagnosis present

## 2014-04-26 MED ORDER — TECHNETIUM TC 99M MEBROFENIN IV KIT
5.0000 | PACK | Freq: Once | INTRAVENOUS | Status: AC | PRN
  Administered 2014-04-26: 5 via INTRAVENOUS

## 2014-04-26 MED ORDER — SINCALIDE 5 MCG IJ SOLR
0.0200 ug/kg | Freq: Once | INTRAMUSCULAR | Status: AC
  Administered 2014-04-26: 1.99 ug via INTRAVENOUS

## 2014-04-26 MED ORDER — SINCALIDE 5 MCG IJ SOLR
INTRAMUSCULAR | Status: AC
  Administered 2014-04-26: 1.99 ug via INTRAVENOUS
  Filled 2014-04-26: qty 5

## 2014-04-26 MED ORDER — SERTRALINE HCL 50 MG PO TABS: ORAL_TABLET | ORAL | Status: DC

## 2014-05-04 ENCOUNTER — Other Ambulatory Visit: Payer: Self-pay

## 2014-05-04 DIAGNOSIS — F4329 Adjustment disorder with other symptoms: Secondary | ICD-10-CM

## 2014-05-04 MED ORDER — CLONAZEPAM 0.5 MG PO TABS: 0.5000 mg | ORAL_TABLET | Freq: Two times a day (BID) | ORAL | Status: DC | PRN

## 2014-05-04 NOTE — Telephone Encounter (Signed)
[facility]

## 2014-05-04 NOTE — Telephone Encounter (Signed)
Last office visit 1.19.15 Med last filled 4.21.15 #60 with 2 refills

## 2014-05-04 NOTE — Telephone Encounter (Signed)
OK X1 

## 2014-05-14 ENCOUNTER — Other Ambulatory Visit (HOSPITAL_COMMUNITY): Payer: Self-pay | Admitting: Gastroenterology

## 2014-05-14 DIAGNOSIS — R748 Abnormal levels of other serum enzymes: Secondary | ICD-10-CM

## 2014-05-16 ENCOUNTER — Encounter: Payer: Self-pay | Admitting: Internal Medicine

## 2014-05-31 ENCOUNTER — Other Ambulatory Visit: Payer: Self-pay | Admitting: Internal Medicine

## 2014-06-18 ENCOUNTER — Other Ambulatory Visit: Payer: Self-pay | Admitting: Internal Medicine

## 2014-08-08 ENCOUNTER — Other Ambulatory Visit: Payer: Self-pay

## 2014-08-08 DIAGNOSIS — F4329 Adjustment disorder with other symptoms: Secondary | ICD-10-CM

## 2014-08-08 MED ORDER — CLONAZEPAM 0.5 MG PO TABS: 0.5000 mg | ORAL_TABLET | Freq: Two times a day (BID) | ORAL | Status: DC | PRN

## 2014-08-08 NOTE — Telephone Encounter (Signed)
[id number] 

## 2014-08-08 NOTE — Telephone Encounter (Signed)
Patient states he has an appointment to see you in March. He requests a refill on this medication before his appointment. Please advise.

## 2014-08-08 NOTE — Telephone Encounter (Signed)
Refill X 3 mos 

## 2014-08-22 ENCOUNTER — Other Ambulatory Visit: Payer: Self-pay

## 2014-08-22 MED ORDER — LOSARTAN POTASSIUM 100 MG PO TABS: 100.0000 mg | ORAL_TABLET | Freq: Every day | ORAL | Status: DC

## 2014-09-05 ENCOUNTER — Other Ambulatory Visit: Payer: Self-pay | Admitting: Internal Medicine

## 2014-09-05 NOTE — Telephone Encounter (Signed)
OK X 3 mos 

## 2014-09-10 ENCOUNTER — Other Ambulatory Visit: Payer: Self-pay | Admitting: Internal Medicine

## 2014-09-10 NOTE — Telephone Encounter (Signed)
OK X 3 mos 

## 2014-09-24 ENCOUNTER — Other Ambulatory Visit: Payer: Self-pay | Admitting: Internal Medicine

## 2014-10-17 ENCOUNTER — Encounter: Payer: Self-pay | Admitting: Internal Medicine

## 2014-10-17 ENCOUNTER — Ambulatory Visit (INDEPENDENT_AMBULATORY_CARE_PROVIDER_SITE_OTHER): Payer: Managed Care, Other (non HMO) | Admitting: Internal Medicine

## 2014-10-17 VITALS — BP 140/90 | HR 69 | Temp 98.4°F | Resp 14 | Ht 70.0 in | Wt 219.5 lb

## 2014-10-17 DIAGNOSIS — Z0000 Encounter for general adult medical examination without abnormal findings: Secondary | ICD-10-CM

## 2014-10-17 DIAGNOSIS — K2270 Barrett's esophagus without dysplasia: Secondary | ICD-10-CM

## 2014-10-17 DIAGNOSIS — E785 Hyperlipidemia, unspecified: Secondary | ICD-10-CM

## 2014-10-17 DIAGNOSIS — I10 Essential (primary) hypertension: Secondary | ICD-10-CM

## 2014-10-17 MED ORDER — ATENOLOL 25 MG PO TABS: 25.0000 mg | ORAL_TABLET | Freq: Every day | ORAL | Status: DC

## 2014-10-17 NOTE — Progress Notes (Signed)
Pre visit review using our clinic review tool, if applicable. No additional management support is needed unless otherwise documented below in the visit note. 

## 2014-10-17 NOTE — Progress Notes (Signed)
Subjective:    Patient ID: Juan Coleman, male    DOB: [birth year], 64 y.o.   MRN: [id number]  HPI He is here for a physical;acute issues are delineated below.  At this time he is on a modified heart healthy diet and restricting salt. His blood pressures have been ranging 130-140/80-90.  Over the past year hedid have significant abdominal pain associated with markedly loose stools.He D/Ced his statin for these symptoms.. He had extensive GI evaluation by Dr Matthias HughsBuccini last year including upper endo, colonoscopy ,CT scan & US. Barrett's was found on endo. Those symptoms have resolved.  He has had increased stresses over the last year with sale of his home & purchase of another.Also he had to have extensive dental work. These precluded regular CVE. He had increased Zoloft to 75 mg; but this caused constant tremor. He subsequently reduced it back to 50 mg but has continued have a tremor. Low-dose propranolol was employed with insignificant benefit. He has talked to a Risk managerneuro chemist who recommended Tenormin 25 mg twice a day.    Review of Systems  Chest pain, palpitations, tachycardia, exertional dyspnea, paroxysmal nocturnal dyspnea, claudication or edema are absent. Unexplained weight loss, abdominal pain, significant dyspepsia, dysphagia, melena, rectal bleeding, or persistently small caliber stools are denied @ this time.     Objective:   Physical Exam  Gen.: Adequately nourished in appearance. Alert, appropriate and cooperative throughout exam. BMI: 31.5 Appears younger than stated age  Head: Normocephalic without obvious abnormalities; pattern alopecia  Eyes: No corneal or conjunctival inflammation noted. Pupils equal round reactive to light and accommodation. Ptosis bilaterally. Extraocular motion intact.  Ears: External  ear exam reveals no significant lesions or deformities. Canals clear .TMs normal. Hearing is grossly normal bilaterally. Nose: External nasal exam reveals no  deformity or inflammation. Nasal mucosa are pink and moist. No lesions or exudates noted.  Septum deviated to right. Mouth: Oral mucosa and oropharynx reveal no lesions or exudates. Teeth in good repair. Neck: No deformities, masses, or tenderness noted. Range of motion & Thyroid normal. Lungs: Normal respiratory effort; chest expands symmetrically. Lungs are clear to auscultation without rales, wheezes, or increased work of breathing. Heart: Normal rate and rhythm. Normal S1 and S2. No gallop, click, or rub. No  murmur. Abdomen: Bowel sounds normal; abdomen soft and nontender. No masses, organomegaly or hernias noted. Genitalia: as per Urology                            Musculoskeletal/extremities: No deformity or scoliosis noted of  the thoracic or lumbar spine.  No clubbing, cyanosis, edema, or significant extremity  deformity noted.  Range of motion normal . Tone & strength normal. Hand joints normal.  Fingernail  health good. Slight crepitus of knees  Able to lie down & sit up w/o help.  Negative SLR bilaterally Vascular: Carotid, radial artery, dorsalis pedis and  posterior tibial pulses are full and equal. No bruits present. Neurologic: Alert and oriented x3. Deep tendon reflexes symmetrical and normal.  Gait normal       Skin: Intact without suspicious lesions or rashes. Lymph: No cervical, axillary lymphadenopathy present. Psych: Mood and affect are normal. Normally interactive  Assessment & Plan:  #1 comprehensive physical exam; no acute findings  Plan: see Orders  & Recommendations

## 2014-10-17 NOTE — Patient Instructions (Signed)
Lipids, LFTs, TSH ,BMET fasting  Minimal Blood Pressure Goal= AVERAGE < 140/90;  Ideal is an AVERAGE < 135/85. This AVERAGE should be calculated from @ least 5-7 BP readings taken @ different times of day on different days of week. You should not respond to isolated BP readings , but rather the AVERAGE for that week .Please bring your  blood pressure cuff to office visits to verify that it is reliable.It  can also be checked against the blood pressure device at the pharmacy.

## 2014-10-18 ENCOUNTER — Telehealth: Payer: Self-pay | Admitting: Internal Medicine

## 2014-10-18 NOTE — Telephone Encounter (Signed)
emmi emailed °

## 2014-10-22 ENCOUNTER — Other Ambulatory Visit: Payer: Self-pay | Admitting: Internal Medicine

## 2014-12-27 ENCOUNTER — Telehealth: Payer: Self-pay | Admitting: Internal Medicine

## 2014-12-27 ENCOUNTER — Other Ambulatory Visit: Payer: Self-pay

## 2014-12-27 DIAGNOSIS — F4329 Adjustment disorder with other symptoms: Secondary | ICD-10-CM

## 2014-12-27 MED ORDER — CLONAZEPAM 0.5 MG PO TABS: 0.5000 mg | ORAL_TABLET | Freq: Two times a day (BID) | ORAL | Status: DC | PRN

## 2014-12-27 NOTE — Telephone Encounter (Signed)
Please advise, thanks.

## 2014-12-27 NOTE — Telephone Encounter (Signed)
Patient requesting refill of clonazePAM (KLONOPIN) 0.5 MG tablet [[id number]][[id number]] . Verified pharmacy on file is correct

## 2014-12-27 NOTE — Telephone Encounter (Signed)
rx for clonopin sent to pharm

## 2014-12-27 NOTE — Telephone Encounter (Signed)
OK X1 

## 2015-01-03 NOTE — Telephone Encounter (Signed)
Pt called stating Rx was not received at pharmacy. Verified with pharmacy, refill authorized verbally

## 2015-04-26 ENCOUNTER — Other Ambulatory Visit: Payer: Self-pay | Admitting: Internal Medicine

## 2015-05-02 ENCOUNTER — Other Ambulatory Visit: Payer: Self-pay | Admitting: Internal Medicine

## 2015-05-20 ENCOUNTER — Other Ambulatory Visit: Payer: Self-pay

## 2015-05-20 DIAGNOSIS — F4329 Adjustment disorder with other symptoms: Secondary | ICD-10-CM

## 2015-05-20 NOTE — Telephone Encounter (Signed)
OK X1;R X 1 

## 2015-05-20 NOTE — Telephone Encounter (Signed)
Rx refill request for clonazepam

## 2015-05-21 ENCOUNTER — Other Ambulatory Visit: Payer: Self-pay | Admitting: Emergency Medicine

## 2015-05-21 DIAGNOSIS — F4329 Adjustment disorder with other symptoms: Secondary | ICD-10-CM

## 2015-05-21 MED ORDER — CLONAZEPAM 0.5 MG PO TABS: 0.5000 mg | ORAL_TABLET | Freq: Two times a day (BID) | ORAL | Status: DC | PRN

## 2015-05-21 NOTE — Telephone Encounter (Signed)
Didn't you do yesterday? Thanks, [employer]

## 2015-05-21 NOTE — Telephone Encounter (Signed)
RX for Klonopin has been faxed to pharm.

## 2015-08-28 ENCOUNTER — Other Ambulatory Visit: Payer: Self-pay | Admitting: Internal Medicine

## 2015-09-11 ENCOUNTER — Other Ambulatory Visit: Payer: Self-pay | Admitting: Internal Medicine

## 2015-10-22 ENCOUNTER — Ambulatory Visit (INDEPENDENT_AMBULATORY_CARE_PROVIDER_SITE_OTHER): Payer: Managed Care, Other (non HMO) | Admitting: Internal Medicine

## 2015-10-22 ENCOUNTER — Encounter: Payer: Self-pay | Admitting: Internal Medicine

## 2015-10-22 VITALS — BP 116/80 | HR 56 | Temp 98.7°F | Resp 16 | Wt 222.0 lb

## 2015-10-22 DIAGNOSIS — Z0000 Encounter for general adult medical examination without abnormal findings: Secondary | ICD-10-CM

## 2015-10-22 DIAGNOSIS — F4329 Adjustment disorder with other symptoms: Secondary | ICD-10-CM

## 2015-10-22 DIAGNOSIS — K22719 Barrett's esophagus with dysplasia, unspecified: Secondary | ICD-10-CM | POA: Diagnosis not present

## 2015-10-22 DIAGNOSIS — Z8546 Personal history of malignant neoplasm of prostate: Secondary | ICD-10-CM | POA: Diagnosis not present

## 2015-10-22 DIAGNOSIS — I10 Essential (primary) hypertension: Secondary | ICD-10-CM | POA: Diagnosis not present

## 2015-10-22 DIAGNOSIS — F419 Anxiety disorder, unspecified: Secondary | ICD-10-CM | POA: Insufficient documentation

## 2015-10-22 DIAGNOSIS — G4700 Insomnia, unspecified: Secondary | ICD-10-CM | POA: Insufficient documentation

## 2015-10-22 MED ORDER — SERTRALINE HCL 50 MG PO TABS: 50.0000 mg | ORAL_TABLET | Freq: Every day | ORAL | Status: DC

## 2015-10-22 MED ORDER — CLONAZEPAM 0.5 MG PO TABS: 0.5000 mg | ORAL_TABLET | Freq: Two times a day (BID) | ORAL | Status: DC | PRN

## 2015-10-22 NOTE — Assessment & Plan Note (Signed)
BP well controlled Current regimen effective and well tolerated Continue current medications at current doses Continue to monitor BP at home 

## 2015-10-22 NOTE — Assessment & Plan Note (Signed)
No evidence of recurrence 

## 2015-10-22 NOTE — Assessment & Plan Note (Signed)
Follows with Dr. Matthias HughsBuccini GERD controlled

## 2015-10-22 NOTE — Patient Instructions (Addendum)
 Test(s) ordered today. Your results will be released to MyChart (or called to you) after review, usually within 72hours after test completion. If any changes need to be made, you will be notified at that same time.  All other Health Maintenance issues reviewed.   All recommended immunizations and age-appropriate screenings are up-to-date.  No immunizations administered today.   Medications reviewed and updated.  No changes recommended at this time.  Your prescription(s) have been submitted to your pharmacy. Please take as directed and contact our office if you believe you are having problem(s) with the medication(s).   Health Maintenance, Male A healthy lifestyle and preventative care can promote health and wellness.  Maintain regular health, dental, and eye exams.  Eat a healthy diet. Foods like vegetables, fruits, whole grains, low-fat dairy products, and lean protein foods contain the nutrients you need and are low in calories. Decrease your intake of foods high in solid fats, added sugars, and salt. Get information about a proper diet from your health care provider, if necessary.  Regular physical exercise is one of the most important things you can do for your health. Most adults should get at least 150 minutes of moderate-intensity exercise (any activity that increases your heart rate and causes you to sweat) each week. In addition, most adults need muscle-strengthening exercises on 2 or more days a week.   Maintain a healthy weight. The body mass index (BMI) is a screening tool to identify possible weight problems. It provides an estimate of body fat based on height and weight. Your health care provider can find your BMI and can help you achieve or maintain a healthy weight. For males 20 years and older:  A BMI below 18.5 is considered underweight.  A BMI of 18.5 to 24.9 is normal.  A BMI of 25 to 29.9 is considered overweight.  A BMI of 30 and above is considered  obese.  Maintain normal blood lipids and cholesterol by exercising and minimizing your intake of saturated fat. Eat a balanced diet with plenty of fruits and vegetables. Blood tests for lipids and cholesterol should begin at age 20 and be repeated every 5 years. If your lipid or cholesterol levels are high, you are over age 50, or you are at high risk for heart disease, you may need your cholesterol levels checked more frequently.Ongoing high lipid and cholesterol levels should be treated with medicines if diet and exercise are not working.  If you smoke, find out from your health care provider how to quit. If you do not use tobacco, do not start.  Lung cancer screening is recommended for adults aged 55-80 years who are at high risk for developing lung cancer because of a history of smoking. A yearly low-dose CT scan of the lungs is recommended for people who have at least a 30-pack-year history of smoking and are current smokers or have quit within the past 15 years. A pack year of smoking is smoking an average of 1 pack of cigarettes a day for 1 year (for example, a 30-pack-year history of smoking could mean smoking 1 pack a day for 30 years or 2 packs a day for 15 years). Yearly screening should continue until the smoker has stopped smoking for at least 15 years. Yearly screening should be stopped for people who develop a health problem that would prevent them from having lung cancer treatment.  If you choose to drink alcohol, do not have more than 2 drinks per day. One drink is   considered to be 12 oz (360 mL) of beer, 5 oz (150 mL) of wine, or 1.5 oz (45 mL) of liquor.  Avoid the use of street drugs. Do not share needles with anyone. Ask for help if you need support or instructions about stopping the use of drugs.  High blood pressure causes heart disease and increases the risk of stroke. High blood pressure is more likely to develop in:  People who have blood pressure in the end of the normal  range (100-139/85-89 mm Hg).  People who are overweight or obese.  People who are African American.  If you are 18-39 years of age, have your blood pressure checked every 3-5 years. If you are 40 years of age or older, have your blood pressure checked every year. You should have your blood pressure measured twice--once when you are at a hospital or clinic, and once when you are not at a hospital or clinic. Record the average of the two measurements. To check your blood pressure when you are not at a hospital or clinic, you can use:  An automated blood pressure machine at a pharmacy.  A home blood pressure monitor.  If you are 45-79 years old, ask your health care provider if you should take aspirin to prevent heart disease.  Diabetes screening involves taking a blood sample to check your fasting blood sugar level. This should be done once every 3 years after age 45 if you are at a normal weight and without risk factors for diabetes. Testing should be considered at a younger age or be carried out more frequently if you are overweight and have at least 1 risk factor for diabetes.  Colorectal cancer can be detected and often prevented. Most routine colorectal cancer screening begins at the age of 50 and continues through age 75. However, your health care provider may recommend screening at an earlier age if you have risk factors for colon cancer. On a yearly basis, your health care provider may provide home test kits to check for hidden blood in the stool. A small camera at the end of a tube may be used to directly examine the colon (sigmoidoscopy or colonoscopy) to detect the earliest forms of colorectal cancer. Talk to your health care provider about this at age 50 when routine screening begins. A direct exam of the colon should be repeated every 5-10 years through age 75, unless early forms of precancerous polyps or small growths are found.  People who are at an increased risk for hepatitis B  should be screened for this virus. You are considered at high risk for hepatitis B if:  You were born in a country where hepatitis B occurs often. Talk with your health care provider about which countries are considered high risk.  Your parents were born in a high-risk country and you have not received a shot to protect against hepatitis B (hepatitis B vaccine).  You have HIV or AIDS.  You use needles to inject street drugs.  You live with, or have sex with, someone who has hepatitis B.  You are a man who has sex with other men (MSM).  You get hemodialysis treatment.  You take certain medicines for conditions like cancer, organ transplantation, and autoimmune conditions.  Hepatitis C blood testing is recommended for all people born from 1945 through 1965 and any individual with known risk factors for hepatitis C.  Healthy men should no longer receive prostate-specific antigen (PSA) blood tests as part of routine cancer screening.   Talk to your health care provider about prostate cancer screening.  Testicular cancer screening is not recommended for adolescents or adult males who have no symptoms. Screening includes self-exam, a health care provider exam, and other screening tests. Consult with your health care provider about any symptoms you have or any concerns you have about testicular cancer.  Practice safe sex. Use condoms and avoid high-risk sexual practices to reduce the spread of sexually transmitted infections (STIs).  You should be screened for STIs, including gonorrhea and chlamydia if:  You are sexually active and are younger than 24 years.  You are older than 24 years, and your health care provider tells you that you are at risk for this type of infection.  Your sexual activity has changed since you were last screened, and you are at an increased risk for chlamydia or gonorrhea. Ask your health care provider if you are at risk.  If you are at risk of being infected with  HIV, it is recommended that you take a prescription medicine daily to prevent HIV infection. This is called pre-exposure prophylaxis (PrEP). You are considered at risk if:  You are a man who has sex with other men (MSM).  You are a heterosexual man who is sexually active with multiple partners.  You take drugs by injection.  You are sexually active with a partner who has HIV.  Talk with your health care provider about whether you are at high risk of being infected with HIV. If you choose to begin PrEP, you should first be tested for HIV. You should then be tested every 3 months for as long as you are taking PrEP.  Use sunscreen. Apply sunscreen liberally and repeatedly throughout the day. You should seek shade when your shadow is shorter than you. Protect yourself by wearing long sleeves, pants, a wide-brimmed hat, and sunglasses year round whenever you are outdoors.  Tell your health care provider of new moles or changes in moles, especially if there is a change in shape or color. Also, tell your health care provider if a mole is larger than the size of a pencil eraser.  A one-time screening for abdominal aortic aneurysm (AAA) and surgical repair of large AAAs by ultrasound is recommended for men aged 65-75 years who are current or former smokers.  Stay current with your vaccines (immunizations).   This information is not intended to replace advice given to you by your health care provider. Make sure you discuss any questions you have with your health care provider.   Document Released: 01/16/2008 Document Revised: 08/10/2014 Document Reviewed: 12/15/2010 Elsevier Interactive Patient Education 2016 [employer] Inc[employer]     

## 2015-10-22 NOTE — Assessment & Plan Note (Signed)
Occasional Clonazepam as needed

## 2015-10-22 NOTE — Progress Notes (Signed)
Pre visit review using our clinic review tool, if applicable. No additional management support is needed unless otherwise documented below in the visit note. 

## 2015-10-22 NOTE — Assessment & Plan Note (Signed)
ambien on occasion

## 2015-10-22 NOTE — Progress Notes (Signed)
Subjective:    Patient ID: Juan Coleman, male    DOB: [birth year], 65 y.o.   MRN: [id number]  HPI He is here for a physical exam.   He is taking all of his medication daily as prescribed.  He has no concerns.    He uses the clonazepam and ambien only on occasion.  Medications and allergies reviewed with patient and updated if appropriate.  Patient Active Problem List   Diagnosis Date Noted  . Barrett's esophagus 10/17/2014  . Nonspecific elevation of levels of transaminase or lactic acid dehydrogenase (LDH) 08/26/2013  . Intracranial bleed (HCC) 05/31/2012  . PROSTATE CANCER, HX OF 06/26/2009  . CARDIAC ARRHYTHMIA 12/19/2008  . Hyperlipidemia 12/29/2007  . 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 HYPERTENSION 12/29/2007    Current Outpatient Prescriptions on File Prior to Visit  Medication Sig Dispense Refill  . amLODipine (NORVASC) 10 MG tablet Take 1 tablet (10 mg total) by mouth daily. 90 tablet 3  . atenolol (TENORMIN) 25 MG tablet Take 1 tablet (25 mg total) by mouth daily. Keep March appt for future refills 90 tablet 0  . clonazePAM (KLONOPIN) 0.5 MG tablet Take 1 tablet (0.5 mg total) by mouth 2 (two) times daily as needed for anxiety. Pt should establish with new PCP 60 tablet 0  . losartan (COZAAR) 100 MG tablet TAKE 1 TABLET (100 MG TOTAL) BY MOUTH DAILY. 90 tablet 1  . sertraline (ZOLOFT) 50 MG tablet TAKE 1 TABLET BY MOUTH DAILY 60 tablet 0   No current facility-administered medications on file prior to visit.    Past Medical History  Diagnosis Date  . Hyperlipidemia   . Hypertension     Past Surgical History  Procedure Laterality Date  . Laparoscopic retropubic prostatectomy  2008    Dr Laverle Patter  . Colonoscopy  2003 & 2015    Dr Matthias Hughs  . Appendectomy    . Upper gi endoscopy  2015    twice    Social History   Social History  . Marital Status: Married    Spouse Name: N/A  . Number of Children: N/A  . Years of Education: N/A   Social History Main Topics    . Smoking status: Current Some Day Smoker    Types: Cigars  . Smokeless tobacco: Not on file     Comment: 2 cigars/ week only when playing golf  . Alcohol Use: Yes     Comment: 14-21 glasses of wine / week except not when on call  . Drug Use: No  . Sexual Activity: Not on file   Other Topics Concern  . Not on file   Social History Narrative    Family History  Problem Relation Age of Onset  . Stroke Mother     in 42s; died of SDH  . Hypertension Mother   . Prostate cancer Father 16  . Diabetes Neg Hx   . Heart disease Neg Hx     Review of Systems  Constitutional: Negative for fever, chills, appetite change, fatigue and unexpected weight change.  HENT: Negative for hearing loss.   Eyes: Negative for visual disturbance.  Respiratory: Negative for cough, shortness of breath and wheezing.   Cardiovascular: Negative for chest pain, palpitations and leg swelling.  Gastrointestinal: Negative for nausea, abdominal pain, diarrhea, constipation and blood in stool.  Genitourinary: Negative for dysuria and hematuria.  Musculoskeletal: Negative for back pain and arthralgias.  Neurological: Negative for dizziness, weakness, light-headedness, numbness and headaches.  Objective:   Filed Vitals:   10/22/15 0803  BP: 116/80  Pulse: 56  Temp: 98.7 F (37.1 C)  Resp: 16   Filed Weights   10/22/15 0803  Weight: 222 lb (100.699 kg)   Body mass index is 31.85 kg/(m^2).   Physical Exam Constitutional: He appears well-developed and well-nourished. No distress.  HENT:  Head: Normocephalic and atraumatic.  Right Ear: External ear normal.  Left Ear: External ear normal.  Mouth/Throat: Oropharynx is clear and moist.  Normal ear canals and TM b/l  Eyes: Conjunctivae and EOM are normal.  Neck: Neck supple. No tracheal deviation present. No thyromegaly present.  No carotid bruit  Cardiovascular: Normal rate, regular rhythm, normal heart sounds and intact distal pulses.   No  murmur heard. Pulmonary/Chest: Effort normal and breath sounds normal. No respiratory distress. He has no wheezes. He has no rales.  Abdominal: Soft. Bowel sounds are normal. He exhibits no distension. There is no tenderness.  Genitourinary: deferred  Musculoskeletal: He exhibits no edema.  Lymphadenopathy:    He has no cervical adenopathy.  Skin: Skin is warm and dry. He is not diaphoretic.  Psychiatric: He has a normal mood and affect. His behavior is normal.         Assessment & Plan:   Physical exam: Screening blood work - he will have it done at work Immunizations up to date Colonoscopy Up to date  Eye exams up to date EKG last ekg reviewed and was normal, no need for an EKG today Exercise - exercising regularly Weight  -working on weight loss Skin - sees derm every couple of years Substance abuse - none   See Problem List for Assessment and Plan of chronic medical problems.  Follow up annually

## 2015-12-12 ENCOUNTER — Other Ambulatory Visit: Payer: Self-pay | Admitting: Internal Medicine

## 2016-02-11 ENCOUNTER — Other Ambulatory Visit: Payer: Self-pay | Admitting: Internal Medicine

## 2016-02-11 NOTE — Telephone Encounter (Signed)
Last OV 10/22/15. You have not filled this RX for pt yet. Please advise.

## 2016-02-11 NOTE — Telephone Encounter (Signed)
RX faxed to [facility] 

## 2016-02-25 ENCOUNTER — Other Ambulatory Visit: Payer: Self-pay | Admitting: Internal Medicine

## 2016-03-18 ENCOUNTER — Other Ambulatory Visit: Payer: Self-pay | Admitting: Internal Medicine

## 2016-03-19 ENCOUNTER — Other Ambulatory Visit: Payer: Self-pay | Admitting: Emergency Medicine

## 2016-03-19 MED ORDER — AMLODIPINE BESYLATE 10 MG PO TABS: 10.0000 mg | ORAL_TABLET | Freq: Every day | ORAL | 3 refills | Status: DC

## 2016-03-19 MED ORDER — LOSARTAN POTASSIUM 100 MG PO TABS: ORAL_TABLET | ORAL | 3 refills | Status: DC

## 2016-03-25 ENCOUNTER — Other Ambulatory Visit: Payer: Self-pay | Admitting: Internal Medicine

## 2016-03-25 DIAGNOSIS — F4329 Adjustment disorder with other symptoms: Secondary | ICD-10-CM

## 2016-03-26 NOTE — Telephone Encounter (Signed)
RX faxed to [facility] 

## 2016-05-20 ENCOUNTER — Other Ambulatory Visit: Payer: Self-pay | Admitting: Internal Medicine

## 2016-05-20 NOTE — Telephone Encounter (Signed)
RX faxed to [facility] 

## 2016-06-30 ENCOUNTER — Other Ambulatory Visit: Payer: Self-pay | Admitting: Internal Medicine

## 2016-07-07 ENCOUNTER — Other Ambulatory Visit: Payer: Self-pay | Admitting: Internal Medicine

## 2016-07-30 ENCOUNTER — Other Ambulatory Visit: Payer: Self-pay | Admitting: Internal Medicine

## 2016-07-30 DIAGNOSIS — F4329 Adjustment disorder with other symptoms: Secondary | ICD-10-CM

## 2016-07-30 NOTE — Telephone Encounter (Signed)
RX faxed to [facility] 

## 2016-09-12 ENCOUNTER — Other Ambulatory Visit: Payer: Self-pay | Admitting: Internal Medicine

## 2016-09-28 ENCOUNTER — Other Ambulatory Visit: Payer: Self-pay | Admitting: Internal Medicine

## 2016-10-22 NOTE — Progress Notes (Addendum)
Subjective:    Patient ID: Juan Coleman, male    DOB: 07/07/1951, 66 y.o.   MRN: [id number]  HPI He is here for a physical exam.   He has no concerns.  He denies changes in his or family history.    He is exercising regularly.    Medications and allergies reviewed with patient and updated if appropriate.  Patient Active Problem List   Diagnosis Date Noted  . Anxiety 10/22/2015  . Insomnia 10/22/2015  . Barrett's esophagus 10/17/2014  . Intracranial bleed (HCC) 05/31/2012  . PROSTATE CANCER, HX OF 06/26/2009  . Cardiac dysrhythmia 12/19/2008  . Hyperlipidemia 12/29/2007  . 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 Essential hypertension 12/29/2007    Current Outpatient Prescriptions on File Prior to Visit  Medication Sig Dispense Refill  . amLODipine (NORVASC) 10 MG tablet Take 1 tablet (10 mg total) by mouth daily. 90 tablet 3  . atenolol (TENORMIN) 25 MG tablet Take 1 tablet (25 mg total) by mouth daily. --- Office visit needed for further refills 90 tablet 0  . clonazePAM (KLONOPIN) 0.5 MG tablet TAKE 1 TABLET BY MOUTH TWICE A DAY AS NEEDED FOR ANXIETY 60 tablet 1  . losartan (COZAAR) 100 MG tablet TAKE 1 TABLET (100 MG TOTAL) BY MOUTH DAILY. 90 tablet 3  . RaNITidine HCl (ZANTAC PO) Take by mouth.    . sertraline (ZOLOFT) 50 MG tablet Take 1 tablet (50 mg total) by mouth daily. Yearly physical w/labs due in March must see Md for refills 180 tablet 0  . zolpidem (AMBIEN) 10 MG tablet TAKE 1 TABLET AT BEDTIME AS NEEDED FOR SLEEP 30 tablet 0   No current facility-administered medications on file prior to visit.     Past Medical History:  Diagnosis Date  . Hyperlipidemia   . Hypertension     Past Surgical History:  Procedure Laterality Date  . APPENDECTOMY    . COLONOSCOPY  2003 & 2015   Dr Matthias HughsBuccini  . LAPAROSCOPIC RETROPUBIC PROSTATECTOMY  2008   Dr Laverle PatterBorden  . UPPER GI ENDOSCOPY  2015   twice    Social History   Social History  . Marital status: Married    Spouse  name: N/A  . Number of children: N/A  . Years of education: N/A   Social History Main Topics  . Smoking status: Former Smoker    Types: Cigars  . Smokeless tobacco: Never Used     Comment: 2 cigars/ week only when playing golf  . Alcohol use Yes  . Drug use: No  . Sexual activity: Not Asked   Other Topics Concern  . None   Social History Narrative  . None    Family History  Problem Relation Age of Onset  . Stroke Mother     in 7480s; died of SDH  . Hypertension Mother   . Prostate cancer Father [id number]  . Diabetes Neg Hx   . Heart disease Neg Hx     Review of Systems  Constitutional: Negative for appetite change, chills, diaphoresis, fatigue, fever and unexpected weight change.  Eyes: Negative for visual disturbance.  Respiratory: Negative for cough, shortness of breath and wheezing.   Cardiovascular: Negative for chest pain, palpitations and leg swelling.  Gastrointestinal: Negative for abdominal pain, blood in stool, constipation, diarrhea and nausea.  Genitourinary: Negative for difficulty urinating, dysuria and hematuria.  Musculoskeletal: Negative for arthralgias and back pain.  Skin: Negative for color change and rash.  Neurological: Negative for dizziness, light-headedness and headaches.  Psychiatric/Behavioral: Negative for dysphoric mood. The patient is not nervous/anxious.        Objective:   Vitals:   10/23/16 0959  BP: 114/82  Pulse: (!) 53  Resp: 16  Temp: 98 F (36.7 C)   Filed Weights   10/23/16 0959  Weight: 218 lb (98.9 kg)   Body mass index is 31.28 kg/m.  Wt Readings from Last 3 Encounters:  10/23/16 218 lb (98.9 kg)  10/22/15 222 lb (100.7 kg)  10/17/14 219 lb 8 oz (99.6 kg)     Physical Exam Constitutional: He appears well-developed and well-nourished. No distress.  HENT:  Head: Normocephalic and atraumatic.  Right Ear: External ear normal.  Left Ear: External ear normal.  Mouth/Throat: Oropharynx is clear and moist.  Normal ear  canals and TM b/l  Eyes: Conjunctivae and EOM are normal.  Neck: Neck supple. No tracheal deviation present. No thyromegaly present.  No carotid bruit  Cardiovascular: Normal rate, regular rhythm, normal heart sounds and intact distal pulses.   No murmur heard. Pulmonary/Chest: Effort normal and breath sounds normal. No respiratory distress. He has no wheezes. He has no rales.  Abdominal: Soft. Bowel sounds are normal. He exhibits no distension. There is no tenderness.  Genitourinary:  deferred to urology Musculoskeletal: He exhibits no edema.  Lymphadenopathy:    He has no cervical adenopathy.  Skin: Skin is warm and dry. He is not diaphoretic.  Psychiatric: He has a normal mood and affect. His behavior is normal.         Assessment & Plan:   Physical exam: Screening blood work   ordered Immunizations  Up to date  Colonoscopy  Up to date  Eye exams  Up to date  EKG  Last 2013, normal and no concerning symptoms Exercise   Regularly - 1-2 times a day Weight     mildly overweight Skin   Sess derm every other year or so Substance abuse   none  See Problem List for Assessment and Plan of chronic medical problems.  FU annually

## 2016-10-22 NOTE — Patient Instructions (Addendum)
Test(s) ordered today. Your results will be released to MyChart (or called to you) after review, usually within 72hours after test completion. If any changes need to be made, you will be notified at that same time.  All other Health Maintenance issues reviewed.   All recommended immunizations and age-appropriate screenings are up-to-date or discussed.  No immunizations administered today.   Medications reviewed and updated.  No changes recommended at this time.  Your prescription(s) have been submitted to your pharmacy. Please take as directed and contact our office if you believe you are having problem(s) with the medication(s).   Please followup in one year    Health Maintenance, Male A healthy lifestyle and preventive care is important for your health and wellness. Ask your health care provider about what schedule of regular examinations is right for you. What should I know about weight and diet?  Eat a Healthy Diet  Eat plenty of vegetables, fruits, whole grains, low-fat dairy products, and lean protein.  Do not eat a lot of foods high in solid fats, added sugars, or salt. Maintain a Healthy Weight  Regular exercise can help you achieve or maintain a healthy weight. You should:  Do at least 150 minutes of exercise each week. The exercise should increase your heart rate and make you sweat (moderate-intensity exercise).  Do strength-training exercises at least twice a week. Watch Your Levels of Cholesterol and Blood Lipids  Have your blood tested for lipids and cholesterol every 5 years starting at 64 years of age. If you are at high risk for heart disease, you should start having your blood tested when you are 64 years old. You may need to have your cholesterol levels checked more often if:  Your lipid or cholesterol levels are high.  You are older than 64 years of age.  You are at high risk for heart disease. What should I know about cancer screening? Many types of  cancers can be detected early and may often be prevented. Lung Cancer  You should be screened every year for lung cancer if:  You are a current smoker who has smoked for at least 30 years.  You are a former smoker who has quit within the past 15 years.  Talk to your health care provider about your screening options, when you should start screening, and how often you should be screened. Colorectal Cancer  Routine colorectal cancer screening usually begins at 64 years of age and should be repeated every 5-10 years until you are 64 years old. You may need to be screened more often if early forms of precancerous polyps or small growths are found. Your health care provider may recommend screening at an earlier age if you have risk factors for colon cancer.  Your health care provider may recommend using home test kits to check for hidden blood in the stool.  A small camera at the end of a tube can be used to examine your colon (sigmoidoscopy or colonoscopy). This checks for the earliest forms of colorectal cancer. Prostate and Testicular Cancer  Depending on your age and overall health, your health care provider may do certain tests to screen for prostate and testicular cancer.  Talk to your health care provider about any symptoms or concerns you have about testicular or prostate cancer. Skin Cancer  Check your skin from head to toe regularly.  Tell your health care provider about any new moles or changes in moles, especially if:  There is a change in a mole's   size, shape, or color.  You have a mole that is larger than a pencil eraser.  Always use sunscreen. Apply sunscreen liberally and repeat throughout the day.  Protect yourself by wearing long sleeves, pants, a wide-brimmed hat, and sunglasses when outside. What should I know about heart disease, diabetes, and high blood pressure?  If you are 18-39 years of age, have your blood pressure checked every 3-5 years. If you are 40 years  of age or older, have your blood pressure checked every year. You should have your blood pressure measured twice-once when you are at a hospital or clinic, and once when you are not at a hospital or clinic. Record the average of the two measurements. To check your blood pressure when you are not at a hospital or clinic, you can use:  An automated blood pressure machine at a pharmacy.  A home blood pressure monitor.  Talk to your health care provider about your target blood pressure.  If you are between 45-79 years old, ask your health care provider if you should take aspirin to prevent heart disease.  Have regular diabetes screenings by checking your fasting blood sugar level.  If you are at a normal weight and have a low risk for diabetes, have this test once every three years after the age of 45.  If you are overweight and have a high risk for diabetes, consider being tested at a younger age or more often.  A one-time screening for abdominal aortic aneurysm (AAA) by ultrasound is recommended for men aged 65-75 years who are current or former smokers. What should I know about preventing infection? Hepatitis B  If you have a higher risk for hepatitis B, you should be screened for this virus. Talk with your health care provider to find out if you are at risk for hepatitis B infection. Hepatitis C  Blood testing is recommended for:  Everyone born from 1945 through 1965.  Anyone with known risk factors for hepatitis C. Sexually Transmitted Diseases (STDs)  You should be screened each year for STDs including gonorrhea and chlamydia if:  You are sexually active and are younger than 64 years of age.  You are older than 64 years of age and your health care provider tells you that you are at risk for this type of infection.  Your sexual activity has changed since you were last screened and you are at an increased risk for chlamydia or gonorrhea. Ask your health care provider if you are at  risk.  Talk with your health care provider about whether you are at high risk of being infected with HIV. Your health care provider may recommend a prescription medicine to help prevent HIV infection. What else can I do?  Schedule regular health, dental, and eye exams.  Stay current with your vaccines (immunizations).  Do not use any tobacco products, such as cigarettes, chewing tobacco, and e-cigarettes. If you need help quitting, ask your health care provider.  Limit alcohol intake to no more than 2 drinks per day. One drink equals 12 ounces of beer, 5 ounces of wine, or 1 ounces of hard liquor.  Do not use street drugs.  Do not share needles.  Ask your health care provider for help if you need support or information about quitting drugs.  Tell your health care provider if you often feel depressed.  Tell your health care provider if you have ever been abused or do not feel safe at home. This information is not intended   to replace advice given to you by your health care provider. Make sure you discuss any questions you have with your health care provider. Document Released: 01/16/2008 Document Revised: 03/18/2016 Document Reviewed: 04/23/2015 Elsevier Interactive Patient Education  2017 [employer]  

## 2016-10-23 ENCOUNTER — Encounter: Payer: Self-pay | Admitting: Internal Medicine

## 2016-10-23 ENCOUNTER — Other Ambulatory Visit (INDEPENDENT_AMBULATORY_CARE_PROVIDER_SITE_OTHER): Payer: Managed Care, Other (non HMO)

## 2016-10-23 ENCOUNTER — Ambulatory Visit (INDEPENDENT_AMBULATORY_CARE_PROVIDER_SITE_OTHER): Payer: Managed Care, Other (non HMO) | Admitting: Internal Medicine

## 2016-10-23 VITALS — BP 114/82 | HR 53 | Temp 98.0°F | Resp 16 | Ht 70.0 in | Wt 218.0 lb

## 2016-10-23 DIAGNOSIS — Z0000 Encounter for general adult medical examination without abnormal findings: Secondary | ICD-10-CM | POA: Diagnosis not present

## 2016-10-23 DIAGNOSIS — F4329 Adjustment disorder with other symptoms: Secondary | ICD-10-CM | POA: Diagnosis not present

## 2016-10-23 DIAGNOSIS — G4700 Insomnia, unspecified: Secondary | ICD-10-CM

## 2016-10-23 DIAGNOSIS — Z8546 Personal history of malignant neoplasm of prostate: Secondary | ICD-10-CM

## 2016-10-23 DIAGNOSIS — F419 Anxiety disorder, unspecified: Secondary | ICD-10-CM | POA: Diagnosis not present

## 2016-10-23 DIAGNOSIS — I10 Essential (primary) hypertension: Secondary | ICD-10-CM | POA: Diagnosis not present

## 2016-10-23 DIAGNOSIS — E7800 Pure hypercholesterolemia, unspecified: Secondary | ICD-10-CM

## 2016-10-23 DIAGNOSIS — K22719 Barrett's esophagus with dysplasia, unspecified: Secondary | ICD-10-CM

## 2016-10-23 LAB — CBC WITH DIFFERENTIAL/PLATELET
BASOS ABS: 0 10*3/uL (ref 0.0–0.1)
Basophils Relative: 0.7 % (ref 0.0–3.0)
EOS PCT: 3.3 % (ref 0.0–5.0)
Eosinophils Absolute: 0.2 10*3/uL (ref 0.0–0.7)
HCT: 42.9 % (ref 39.0–52.0)
Hemoglobin: 14.6 g/dL (ref 13.0–17.0)
LYMPHS ABS: 2 10*3/uL (ref 0.7–4.0)
Lymphocytes Relative: 28 % (ref 12.0–46.0)
MCHC: 33.9 g/dL (ref 30.0–36.0)
MCV: 94.5 fl (ref 78.0–100.0)
MONO ABS: 0.7 10*3/uL (ref 0.1–1.0)
Monocytes Relative: 9.7 % (ref 3.0–12.0)
Neutro Abs: 4.2 10*3/uL (ref 1.4–7.7)
Neutrophils Relative %: 58.3 % (ref 43.0–77.0)
Platelets: 274 10*3/uL (ref 150.0–400.0)
RBC: 4.54 Mil/uL (ref 4.22–5.81)
RDW: 13.5 % (ref 11.5–15.5)
WBC: 7.3 10*3/uL (ref 4.0–10.5)

## 2016-10-23 LAB — VITAMIN D 25 HYDROXY (VIT D DEFICIENCY, FRACTURES): VITD: 23.5 ng/mL — AB (ref 30.00–100.00)

## 2016-10-23 LAB — COMPREHENSIVE METABOLIC PANEL
ALK PHOS: 52 U/L (ref 39–117)
ALT: 29 U/L (ref 0–53)
AST: 33 U/L (ref 0–37)
Albumin: 4.6 g/dL (ref 3.5–5.2)
BUN: 19 mg/dL (ref 6–23)
CO2: 25 mEq/L (ref 19–32)
Calcium: 10 mg/dL (ref 8.4–10.5)
Chloride: 105 mEq/L (ref 96–112)
Creatinine, Ser: 1.02 mg/dL (ref 0.40–1.50)
GFR: 78.11 mL/min (ref >60.00)
GLUCOSE: 108 mg/dL — AB (ref 70–99)
POTASSIUM: 4.7 meq/L (ref 3.5–5.1)
SODIUM: 139 meq/L (ref 135–145)
TOTAL PROTEIN: 7.8 g/dL (ref 6.0–8.3)
Total Bilirubin: 1 mg/dL (ref 0.2–1.2)

## 2016-10-23 LAB — LIPID PANEL
CHOL/HDL RATIO: 3
Cholesterol: 254 mg/dL — ABNORMAL HIGH (ref 0–200)
HDL: 85.6 mg/dL (ref >39.00)
LDL CALC: 155 mg/dL — AB (ref 0–99)
NONHDL: 168.78
Triglycerides: 68 mg/dL (ref 0.0–149.0)
VLDL: 13.6 mg/dL (ref 0.0–40.0)

## 2016-10-23 LAB — HEMOGLOBIN A1C: Hgb A1c MFr Bld: 5.6 % (ref 4.6–6.5)

## 2016-10-23 LAB — TSH: TSH: 0.58 u[IU]/mL (ref 0.35–4.50)

## 2016-10-23 MED ORDER — CLONAZEPAM 0.5 MG PO TABS: ORAL_TABLET | ORAL | 3 refills | Status: DC

## 2016-10-23 NOTE — Assessment & Plan Note (Signed)
occ clonazepam  Refilled today

## 2016-10-23 NOTE — Assessment & Plan Note (Signed)
Takes [place] on occasion Ok to continue

## 2016-10-23 NOTE — Assessment & Plan Note (Signed)
Scheduled for EGD this year GERD is controlled

## 2016-10-23 NOTE — Progress Notes (Signed)
Pre visit review using our clinic review tool, if applicable. No additional management support is needed unless otherwise documented below in the visit note. 

## 2016-10-23 NOTE — Assessment & Plan Note (Signed)
Following with urology

## 2016-10-23 NOTE — Assessment & Plan Note (Signed)
Check lipid panel  May need to restart statin Regular exercise and healthy diet

## 2016-10-23 NOTE — Assessment & Plan Note (Signed)
BP well controlled Current regimen effective and well tolerated Continue current medications at current doses  

## 2016-10-26 ENCOUNTER — Other Ambulatory Visit: Payer: Self-pay | Admitting: Internal Medicine

## 2016-10-26 MED ORDER — PRAVASTATIN SODIUM 20 MG PO TABS: 20.0000 mg | ORAL_TABLET | Freq: Every day | ORAL | 3 refills | Status: DC

## 2016-11-17 ENCOUNTER — Telehealth: Payer: Self-pay | Admitting: Internal Medicine

## 2016-11-17 NOTE — Telephone Encounter (Signed)
Left Dr. Arelia Sneddon a vm states that we have received a letter from [facility] in response to not covering labs done on 3/23.  Stated that we are in the process of reviewing this and once it is taken care of we will follow up in regard.

## 2016-11-18 ENCOUNTER — Other Ambulatory Visit: Payer: Self-pay | Admitting: Internal Medicine

## 2016-11-18 DIAGNOSIS — F4329 Adjustment disorder with other symptoms: Secondary | ICD-10-CM

## 2016-11-19 NOTE — Telephone Encounter (Signed)
Called refills into [facility] via my personal cellphone left MD approval on pharmacy vm...Raechel Chute

## 2016-12-04 NOTE — Telephone Encounter (Signed)
No balance to patient, coding error written off. Just FYI.

## 2016-12-16 ENCOUNTER — Other Ambulatory Visit: Payer: Self-pay | Admitting: Internal Medicine

## 2016-12-19 ENCOUNTER — Other Ambulatory Visit: Payer: Self-pay | Admitting: Internal Medicine

## 2017-03-18 ENCOUNTER — Other Ambulatory Visit: Payer: Self-pay | Admitting: Internal Medicine

## 2017-04-12 ENCOUNTER — Other Ambulatory Visit: Payer: Self-pay | Admitting: Internal Medicine

## 2017-04-12 NOTE — Telephone Encounter (Signed)
Unable to pull up on controlled database. Last filled on 11/18/16 per chart.

## 2017-04-13 NOTE — Telephone Encounter (Signed)
RX faxed to [facility] 

## 2017-04-25 ENCOUNTER — Other Ambulatory Visit: Payer: Self-pay | Admitting: Internal Medicine

## 2017-06-18 DIAGNOSIS — K2270 Barrett's esophagus without dysplasia: Secondary | ICD-10-CM | POA: Diagnosis not present

## 2017-06-22 DIAGNOSIS — K2270 Barrett's esophagus without dysplasia: Secondary | ICD-10-CM | POA: Diagnosis not present

## 2017-07-07 ENCOUNTER — Other Ambulatory Visit: Payer: Self-pay | Admitting: Internal Medicine

## 2017-09-10 ENCOUNTER — Other Ambulatory Visit: Payer: Self-pay | Admitting: Emergency Medicine

## 2017-09-10 MED ORDER — LOSARTAN POTASSIUM 100 MG PO TABS: ORAL_TABLET | ORAL | 0 refills | Status: DC

## 2017-09-10 MED ORDER — ATENOLOL 25 MG PO TABS: 25.0000 mg | ORAL_TABLET | Freq: Every day | ORAL | 0 refills | Status: DC

## 2017-10-08 ENCOUNTER — Other Ambulatory Visit: Payer: Self-pay | Admitting: Emergency Medicine

## 2017-10-08 MED ORDER — SERTRALINE HCL 50 MG PO TABS: 50.0000 mg | ORAL_TABLET | Freq: Every day | ORAL | 0 refills | Status: DC

## 2017-11-10 ENCOUNTER — Other Ambulatory Visit: Payer: Self-pay | Admitting: Internal Medicine

## 2017-11-10 DIAGNOSIS — F4329 Adjustment disorder with other symptoms: Secondary | ICD-10-CM

## 2017-11-10 NOTE — Telephone Encounter (Signed)
Routing to dr burns, please advise, thanks 

## 2017-11-16 ENCOUNTER — Other Ambulatory Visit: Payer: Self-pay | Admitting: Internal Medicine

## 2017-11-17 NOTE — Telephone Encounter (Signed)
[place] Controlled Substance Database checked. Last filled on  12/22/16 

## 2017-12-07 ENCOUNTER — Other Ambulatory Visit: Payer: Self-pay | Admitting: Internal Medicine

## 2017-12-30 NOTE — Patient Instructions (Addendum)
Test(s) ordered today. Your results will be released to MyChart (or called to you) after review, usually within 72hours after test completion. If any changes need to be made, you will be notified at that same time.  All other Health Maintenance issues reviewed.   All recommended immunizations and age-appropriate screenings are up-to-date or discussed.  prevnar immunization administered today.   Medications reviewed and updated.  /  No changes recommended at this time.   Please followup in one year for your physical    Health Maintenance, Male A healthy lifestyle and preventive care is important for your health and wellness. Ask your health care provider about what schedule of regular examinations is right for you. What should I know about weight and diet? Eat a Healthy Diet  Eat plenty of vegetables, fruits, whole grains, low-fat dairy products, and lean protein.  Do not eat a lot of foods high in solid fats, added sugars, or salt.  Maintain a Healthy Weight Regular exercise can help you achieve or maintain a healthy weight. You should:  Do at least 150 minutes of exercise each week. The exercise should increase your heart rate and make you sweat (moderate-intensity exercise).  Do strength-training exercises at least twice a week.  Watch Your Levels of Cholesterol and Blood Lipids  Have your blood tested for lipids and cholesterol every 5 years starting at 65 years of age. If you are at high risk for heart disease, you should start having your blood tested when you are 65 years old. You may need to have your cholesterol levels checked more often if: ? Your lipid or cholesterol levels are high. ? You are older than 65 years of age. ? You are at high risk for heart disease.  What should I know about cancer screening? Many types of cancers can be detected early and may often be prevented. Lung Cancer  You should be screened every year for lung cancer if: ? You are a current  smoker who has smoked for at least 30 years. ? You are a former smoker who has quit within the past 15 years.  Talk to your health care provider about your screening options, when you should start screening, and how often you should be screened.  Colorectal Cancer  Routine colorectal cancer screening usually begins at 65 years of age and should be repeated every 5-10 years until you are 65 years old. You may need to be screened more often if early forms of precancerous polyps or small growths are found. Your health care provider may recommend screening at an earlier age if you have risk factors for colon cancer.  Your health care provider may recommend using home test kits to check for hidden blood in the stool.  A small camera at the end of a tube can be used to examine your colon (sigmoidoscopy or colonoscopy). This checks for the earliest forms of colorectal cancer.  Prostate and Testicular Cancer  Depending on your age and overall health, your health care provider may do certain tests to screen for prostate and testicular cancer.  Talk to your health care provider about any symptoms or concerns you have about testicular or prostate cancer.  Skin Cancer  Check your skin from head to toe regularly.  Tell your health care provider about any new moles or changes in moles, especially if: ? There is a change in a mole's size, shape, or color. ? You have a mole that is larger than a pencil eraser.  Always  use sunscreen. Apply sunscreen liberally and repeat throughout the day.  Protect yourself by wearing long sleeves, pants, a wide-brimmed hat, and sunglasses when outside.  What should I know about heart disease, diabetes, and high blood pressure?  If you are 49-63 years of age, have your blood pressure checked every 3-5 years. If you are 33 years of age or older, have your blood pressure checked every year. You should have your blood pressure measured twice-once when you are at a  hospital or clinic, and once when you are not at a hospital or clinic. Record the average of the two measurements. To check your blood pressure when you are not at a hospital or clinic, you can use: ? An automated blood pressure machine at a pharmacy. ? A home blood pressure monitor.  Talk to your health care provider about your target blood pressure.  If you are between 28-11 years old, ask your health care provider if you should take aspirin to prevent heart disease.  Have regular diabetes screenings by checking your fasting blood sugar level. ? If you are at a normal weight and have a low risk for diabetes, have this test once every three years after the age of 71. ? If you are overweight and have a high risk for diabetes, consider being tested at a younger age or more often.  A one-time screening for abdominal aortic aneurysm (AAA) by ultrasound is recommended for men aged 21-75 years who are current or former smokers. What should I know about preventing infection? Hepatitis B If you have a higher risk for hepatitis B, you should be screened for this virus. Talk with your health care provider to find out if you are at risk for hepatitis B infection. Hepatitis C Blood testing is recommended for:  Everyone born from [birth year] through 1965.  Anyone with known risk factors for hepatitis C.  Sexually Transmitted Diseases (STDs)  You should be screened each year for STDs including gonorrhea and chlamydia if: ? You are sexually active and are younger than 66 years of age. ? You are older than 65 years of age and your health care provider tells you that you are at risk for this type of infection. ? Your sexual activity has changed since you were last screened and you are at an increased risk for chlamydia or gonorrhea. Ask your health care provider if you are at risk.  Talk with your health care provider about whether you are at high risk of being infected with HIV. Your health care provider may  recommend a prescription medicine to help prevent HIV infection.  What else can I do?  Schedule regular health, dental, and eye exams.  Stay current with your vaccines (immunizations).  Do not use any tobacco products, such as cigarettes, chewing tobacco, and e-cigarettes. If you need help quitting, ask your health care provider.  Limit alcohol intake to no more than 2 drinks per day. One drink equals 12 ounces of beer, 5 ounces of wine, or 1 ounces of hard liquor.  Do not use street drugs.  Do not share needles.  Ask your health care provider for help if you need support or information about quitting drugs.  Tell your health care provider if you often feel depressed.  Tell your health care provider if you have ever been abused or do not feel safe at home. This information is not intended to replace advice given to you by your health care provider. Make sure you discuss any questions  you have with your health care provider. Document Released: 01/16/2008 Document Revised: 03/18/2016 Document Reviewed: 04/23/2015 Elsevier Interactive Patient Education  [employer].

## 2017-12-30 NOTE — Progress Notes (Signed)
Subjective:    Patient ID: Juan Coleman, male    DOB: Jun 13, 1951, 67 y.o.   MRN: [id number]  HPI He is here for a physical exam.   He denies any changes in his history.  He has no concerns.     Medications and allergies reviewed with patient and updated if appropriate.  Patient Active Problem List   Diagnosis Date Noted  . Anxiety 10/22/2015  . Insomnia 10/22/2015  . Barrett's esophagus 10/17/2014  . Intracranial bleed (HCC) 05/31/2012  . PROSTATE CANCER, HX OF 06/26/2009  . Cardiac dysrhythmia 12/19/2008  . Hyperlipidemia 12/29/2007  . 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 Essential hypertension 12/29/2007    Current Outpatient Medications on File Prior to Visit  Medication Sig Dispense Refill  . amLODipine (NORVASC) 10 MG tablet TAKE 1 TABLET (10 MG TOTAL) BY MOUTH DAILY. 90 tablet 2  . atenolol (TENORMIN) 25 MG tablet TAKE 1 TABLET (25 MG TOTAL) BY MOUTH DAILY. -- OFFICE VISIT NEEDED FOR FURTHER REFILLS 90 tablet 0  . clonazePAM (KLONOPIN) 0.5 MG tablet TAKE 1 TABLET TWICE DAILY 60 tablet 2  . losartan (COZAAR) 100 MG tablet TAKE 1 TABLET (100 MG TOTAL) BY MOUTH DAILY. -- OFFICE VISIT NEEDED FOR FURTHER REFILLS 90 tablet 0  . pravastatin (PRAVACHOL) 20 MG tablet Take 1 tablet (20 mg total) by mouth daily. 90 tablet 3  . RaNITidine HCl (ZANTAC PO) Take by mouth.    . sertraline (ZOLOFT) 50 MG tablet Take 1 tablet (50 mg total) by mouth daily. -- Office visit needed for further refills 180 tablet 0  . zolpidem (AMBIEN) 10 MG tablet TAKE 1 TABLET BY MOUTH AS NEEDED FOR SLEEP 30 tablet 0   No current facility-administered medications on file prior to visit.     Past Medical History:  Diagnosis Date  . Hyperlipidemia   . Hypertension     Past Surgical History:  Procedure Laterality Date  . APPENDECTOMY    . COLONOSCOPY  2003 & 2015   Dr Matthias Hughs  . LAPAROSCOPIC RETROPUBIC PROSTATECTOMY  2008   Dr Laverle Patter  . UPPER GI ENDOSCOPY  2015   twice    Social History    Socioeconomic History  . Marital status: Married    Spouse name: Not on file  . Number of children: Not on file  . Years of education: Not on file  . Highest education level: Not on file  Occupational History  . Not on file  Social Needs  . Financial resource strain: Not on file  . Food insecurity:    Worry: Not on file    Inability: Not on file  . Transportation needs:    Medical: Not on file    Non-medical: Not on file  Tobacco Use  . Smoking status: Former Games developer  . Smokeless tobacco: Never Used  Substance and Sexual Activity  . Alcohol use: Yes  . Drug use: No  . Sexual activity: Not on file  Lifestyle  . Physical activity:    Days per week: Not on file    Minutes per session: Not on file  . Stress: Not on file  Relationships  . Social connections:    Talks on phone: Not on file    Gets together: Not on file    Attends religious service: Not on file    Active member of club or organization: Not on file    Attends meetings of clubs or organizations: Not on file    Relationship status: Not on file  Other Topics Concern  . Not on file  Social History Narrative  . Not on file    Family History  Problem Relation Age of Onset  . Stroke Mother        in 67s; died of SDH  . Hypertension Mother   . Prostate cancer Father 27  . Diabetes Neg Hx   . Heart disease Neg Hx     Review of Systems  Constitutional: Negative for chills and fever.  Eyes: Negative for visual disturbance.  Respiratory: Negative for cough, shortness of breath and wheezing.   Cardiovascular: Negative for chest pain, palpitations and leg swelling.  Gastrointestinal: Negative for abdominal pain, blood in stool, constipation, diarrhea and nausea.  Genitourinary: Negative for dysuria and hematuria.  Musculoskeletal: Negative for arthralgias and back pain.  Skin: Negative for color change and rash.  Neurological: Negative for light-headedness and headaches.  Psychiatric/Behavioral: Positive  for dysphoric mood (controlled). The patient is nervous/anxious (controlled).        Objective:   Vitals:   12/31/17 0811  BP: 116/78  Pulse: 60  Resp: 16  Temp: 98 F (36.7 C)  SpO2: 98%   Filed Weights   12/31/17 0811  Weight: 224 lb (101.6 kg)   Body mass index is 32.14 kg/m.  Wt Readings from Last 3 Encounters:  12/31/17 224 lb (101.6 kg)  10/23/16 218 lb (98.9 kg)  10/22/15 222 lb (100.7 kg)     Physical Exam Constitutional: He appears well-developed and well-nourished. No distress.  HENT:  Head: Normocephalic and atraumatic.  Right Ear: External ear normal.  Left Ear: External ear normal.  Mouth/Throat: Oropharynx is clear and moist.  Normal ear canals and TM b/l  Eyes: Conjunctivae and EOM are normal.  Neck: Neck supple. No tracheal deviation present. No thyromegaly present.  No carotid bruit  Cardiovascular: Normal rate, regular rhythm, normal heart sounds and intact distal pulses.  No murmur heard. Pulmonary/Chest: Effort normal and breath sounds normal. No respiratory distress. He has no wheezes. He has no rales.  Abdominal: Soft. He exhibits no distension. There is no tenderness.  Genitourinary: deferred  Musculoskeletal: He exhibits no edema.  Lymphadenopathy:   He has no cervical adenopathy.  Skin: Skin is warm and dry. He is not diaphoretic.  Psychiatric: He has a normal mood and affect. His behavior is normal.         Assessment & Plan:   Physical exam: Screening blood work  ordered Immunizations   prevnar today , discussed shingrix, others up to date Colonoscopy  Up to date  Eye exams   Up to date  EKG   Last done 05/2012 Exercise   regular Weight   Weight is up Skin   No concerns - sees derm annually Substance abuse   none  See Problem List for Assessment and Plan of chronic medical problems.   FU annually

## 2017-12-31 ENCOUNTER — Other Ambulatory Visit (INDEPENDENT_AMBULATORY_CARE_PROVIDER_SITE_OTHER): Payer: Managed Care, Other (non HMO)

## 2017-12-31 ENCOUNTER — Ambulatory Visit (INDEPENDENT_AMBULATORY_CARE_PROVIDER_SITE_OTHER): Payer: Managed Care, Other (non HMO) | Admitting: Internal Medicine

## 2017-12-31 ENCOUNTER — Encounter: Payer: Self-pay | Admitting: Internal Medicine

## 2017-12-31 VITALS — BP 116/78 | HR 60 | Temp 98.0°F | Resp 16 | Ht 70.0 in | Wt 224.0 lb

## 2017-12-31 DIAGNOSIS — F419 Anxiety disorder, unspecified: Secondary | ICD-10-CM

## 2017-12-31 DIAGNOSIS — Z1159 Encounter for screening for other viral diseases: Secondary | ICD-10-CM

## 2017-12-31 DIAGNOSIS — Z8546 Personal history of malignant neoplasm of prostate: Secondary | ICD-10-CM | POA: Diagnosis not present

## 2017-12-31 DIAGNOSIS — I10 Essential (primary) hypertension: Secondary | ICD-10-CM

## 2017-12-31 DIAGNOSIS — Z23 Encounter for immunization: Secondary | ICD-10-CM

## 2017-12-31 DIAGNOSIS — E782 Mixed hyperlipidemia: Secondary | ICD-10-CM

## 2017-12-31 DIAGNOSIS — Z114 Encounter for screening for human immunodeficiency virus [HIV]: Secondary | ICD-10-CM | POA: Diagnosis not present

## 2017-12-31 DIAGNOSIS — Z0000 Encounter for general adult medical examination without abnormal findings: Secondary | ICD-10-CM

## 2017-12-31 DIAGNOSIS — E559 Vitamin D deficiency, unspecified: Secondary | ICD-10-CM | POA: Insufficient documentation

## 2017-12-31 DIAGNOSIS — G4700 Insomnia, unspecified: Secondary | ICD-10-CM | POA: Diagnosis not present

## 2017-12-31 LAB — CBC WITH DIFFERENTIAL/PLATELET
Basophils Absolute: 0 10*3/uL (ref 0.0–0.1)
Basophils Relative: 0.7 % (ref 0.0–3.0)
EOS ABS: 0.2 10*3/uL (ref 0.0–0.7)
Eosinophils Relative: 3.4 % (ref 0.0–5.0)
HCT: 41.1 % (ref 39.0–52.0)
HEMOGLOBIN: 13.8 g/dL (ref 13.0–17.0)
LYMPHS PCT: 22.2 % (ref 12.0–46.0)
Lymphs Abs: 1.5 10*3/uL (ref 0.7–4.0)
MCHC: 33.6 g/dL (ref 30.0–36.0)
MCV: 94.2 fl (ref 78.0–100.0)
MONO ABS: 0.7 10*3/uL (ref 0.1–1.0)
Monocytes Relative: 10.4 % (ref 3.0–12.0)
Neutro Abs: 4.3 10*3/uL (ref 1.4–7.7)
Neutrophils Relative %: 63.3 % (ref 43.0–77.0)
Platelets: 260 10*3/uL (ref 150.0–400.0)
RBC: 4.36 Mil/uL (ref 4.22–5.81)
RDW: 13.9 % (ref 11.5–15.5)
WBC: 6.7 10*3/uL (ref 4.0–10.5)

## 2017-12-31 LAB — LIPID PANEL
Cholesterol: 228 mg/dL — ABNORMAL HIGH (ref 0–200)
HDL: 80.6 mg/dL (ref >39.00)
LDL Cholesterol: 135 mg/dL — ABNORMAL HIGH (ref 0–99)
NONHDL: 147.78
Total CHOL/HDL Ratio: 3
Triglycerides: 64 mg/dL (ref 0.0–149.0)
VLDL: 12.8 mg/dL (ref 0.0–40.0)

## 2017-12-31 LAB — COMPREHENSIVE METABOLIC PANEL
ALBUMIN: 4.5 g/dL (ref 3.5–5.2)
ALK PHOS: 67 U/L (ref 39–117)
ALT: 32 U/L (ref 0–53)
AST: 29 U/L (ref 0–37)
BUN: 18 mg/dL (ref 6–23)
CALCIUM: 9.6 mg/dL (ref 8.4–10.5)
CO2: 26 mEq/L (ref 19–32)
CREATININE: 1.05 mg/dL (ref 0.40–1.50)
Chloride: 106 mEq/L (ref 96–112)
GFR: 75.26 mL/min (ref >60.00)
Glucose, Bld: 124 mg/dL — ABNORMAL HIGH (ref 70–99)
Potassium: 4.6 mEq/L (ref 3.5–5.1)
SODIUM: 140 meq/L (ref 135–145)
TOTAL PROTEIN: 8 g/dL (ref 6.0–8.3)
Total Bilirubin: 0.6 mg/dL (ref 0.2–1.2)

## 2017-12-31 LAB — VITAMIN D 25 HYDROXY (VIT D DEFICIENCY, FRACTURES): VITD: 16.68 ng/mL — AB (ref 30.00–100.00)

## 2017-12-31 LAB — TSH: TSH: 0.91 u[IU]/mL (ref 0.35–4.50)

## 2017-12-31 NOTE — Assessment & Plan Note (Signed)
Following with urology

## 2017-12-31 NOTE — Assessment & Plan Note (Signed)
Check lipid panel  Continue daily statin Regular exercise and healthy diet encouraged  

## 2017-12-31 NOTE — Assessment & Plan Note (Signed)
Check level Not currently taking vitamin d

## 2017-12-31 NOTE — Assessment & Plan Note (Signed)
Controlled, stable Continue current dose of medication  

## 2017-12-31 NOTE — Assessment & Plan Note (Signed)
BP well controlled Current regimen effective and well tolerated Continue current medications at current doses cmp  

## 2018-01-01 LAB — HEPATITIS C ANTIBODY
HEP C AB: NONREACTIVE
SIGNAL TO CUT-OFF: 0.03 (ref <1.00)

## 2018-01-01 LAB — HIV ANTIBODY (ROUTINE TESTING W REFLEX): HIV 1&2 Ab, 4th Generation: NONREACTIVE

## 2018-01-03 ENCOUNTER — Encounter: Payer: Self-pay | Admitting: Internal Medicine

## 2018-01-03 ENCOUNTER — Other Ambulatory Visit (INDEPENDENT_AMBULATORY_CARE_PROVIDER_SITE_OTHER): Payer: Managed Care, Other (non HMO)

## 2018-01-03 DIAGNOSIS — R739 Hyperglycemia, unspecified: Secondary | ICD-10-CM | POA: Diagnosis not present

## 2018-01-03 LAB — HEMOGLOBIN A1C: Hgb A1c MFr Bld: 5.7 % (ref 4.6–6.5)

## 2018-03-06 ENCOUNTER — Other Ambulatory Visit: Payer: Self-pay | Admitting: Internal Medicine

## 2018-03-08 ENCOUNTER — Other Ambulatory Visit: Payer: Self-pay | Admitting: Internal Medicine

## 2018-03-08 DIAGNOSIS — F4329 Adjustment disorder with other symptoms: Secondary | ICD-10-CM

## 2018-03-08 NOTE — Telephone Encounter (Signed)
[place] Controlled Substance Database checked. Last filled on 01/23/18

## 2018-04-12 ENCOUNTER — Other Ambulatory Visit: Payer: Self-pay | Admitting: Internal Medicine

## 2018-06-03 ENCOUNTER — Ambulatory Visit (INDEPENDENT_AMBULATORY_CARE_PROVIDER_SITE_OTHER): Payer: Managed Care, Other (non HMO)

## 2018-06-03 DIAGNOSIS — Z23 Encounter for immunization: Secondary | ICD-10-CM | POA: Diagnosis not present

## 2018-06-04 ENCOUNTER — Other Ambulatory Visit: Payer: Self-pay | Admitting: Internal Medicine

## 2018-06-09 ENCOUNTER — Telehealth: Payer: Self-pay

## 2018-06-09 NOTE — Telephone Encounter (Signed)
Flu shot has been faxed over.

## 2018-06-09 NOTE — Telephone Encounter (Signed)
Copied from CRM [id number]. Topic: Medical Record Request - Other >> Jun 09, 2018  8:27 AM Crist Infante wrote: Haynes Dage with [facility] calling to ask you to fax proof of pt's flu shot he had here faxed to her.  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  Fax:  [phone]  [facility] [phone] direct line ext [phone]

## 2018-07-04 ENCOUNTER — Other Ambulatory Visit: Payer: Self-pay | Admitting: Internal Medicine

## 2018-07-04 DIAGNOSIS — F4329 Adjustment disorder with other symptoms: Secondary | ICD-10-CM

## 2018-07-04 NOTE — Telephone Encounter (Signed)
Last refill was 05/18/18 Last OV was 12/31/17 Next OV is not made

## 2018-07-07 ENCOUNTER — Other Ambulatory Visit: Payer: Self-pay | Admitting: Internal Medicine

## 2018-07-11 ENCOUNTER — Ambulatory Visit: Payer: Self-pay | Admitting: *Deleted

## 2018-07-11 ENCOUNTER — Encounter: Payer: Self-pay | Admitting: Internal Medicine

## 2018-07-11 ENCOUNTER — Ambulatory Visit (INDEPENDENT_AMBULATORY_CARE_PROVIDER_SITE_OTHER): Payer: Managed Care, Other (non HMO) | Admitting: Internal Medicine

## 2018-07-11 VITALS — BP 148/82 | HR 37 | Temp 98.5°F | Resp 16 | Ht 70.0 in | Wt 227.0 lb

## 2018-07-11 DIAGNOSIS — R002 Palpitations: Secondary | ICD-10-CM | POA: Diagnosis not present

## 2018-07-11 DIAGNOSIS — I493 Ventricular premature depolarization: Secondary | ICD-10-CM | POA: Diagnosis not present

## 2018-07-11 NOTE — Assessment & Plan Note (Signed)
Frequent PVC's for over two weeks, mildly symptomatic H/o Afib once Initially stopped the atenolol Restarted atenolol yesterday - can titrate if tolerated - ideally if he can increase this it may help Discussed stopping amlodipine and increasing atenolol or changing to metoprolol - he would like to continue current meds and will increase the atenolol if BP, HR allows Referred to cardiology - has appt in Jan but hopefully he can be seen sooner  EKG today - sinus rhythm at 69 bpm, frequent PVC's - ventricular trigeminy, poor R wave progression - PVCs are new - no other changes compared to prior EKG

## 2018-07-11 NOTE — Patient Instructions (Signed)
   Medications reviewed and updated.  Changes include :   Continue the atenolol and increase if tolerated   A referral was ordered for cardiology

## 2018-07-11 NOTE — Progress Notes (Signed)
Subjective:    Patient ID: Juan Coleman, male    DOB: 06-16-1951, 67 y.o.   MRN: [id number]  HPI The patient is here for an acute visit.  For the past 2-2 1/2 weeks he has been having palpitations.  His apple watch has shown some PVCs.  He denies any chest pain, SOB or cold symptoms.  He is still functioning and has been able to exercise, but it does bother him to have the palpations.  He has been able to sleep.    He denies increase caffeine intake and over the counter cold medications.  He did initially stop the atenolol and they did get worse.  He just restarted the atenolol yesterday.   His BP at home has been 130/80-90 and HR often in the 60's.       Medications and allergies reviewed with patient and updated if appropriate.  Patient Active Problem List   Diagnosis Date Noted  . Vitamin D deficiency 12/31/2017  . Anxiety 10/22/2015  . Insomnia 10/22/2015  . Barrett's esophagus 10/17/2014  . Intracranial bleed (HCC) 05/31/2012  . PROSTATE CANCER, HX OF 06/26/2009  . Cardiac dysrhythmia 12/19/2008  . Hyperlipidemia 12/29/2007  . 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 Essential hypertension 12/29/2007    Current Outpatient Medications on File Prior to Visit  Medication Sig Dispense Refill  . amLODipine (NORVASC) 10 MG tablet TAKE 1 TABLET BY MOUTH EVERY DAY 90 tablet 2  . clonazePAM (KLONOPIN) 0.5 MG tablet TAKE 1 TABLET BY MOUTH TWICE A DAY 60 tablet 2  . losartan (COZAAR) 100 MG tablet TAKE 1 TABLET (100 MG TOTAL) BY MOUTH DAILY. 90 tablet 2  . pravastatin (PRAVACHOL) 20 MG tablet Take 1 tablet (20 mg total) by mouth daily. 90 tablet 3  . RaNITidine HCl (ZANTAC PO) Take by mouth.    . sertraline (ZOLOFT) 50 MG tablet Take 1 tablet (50 mg total) by mouth daily. 90 tablet 1  . zolpidem (AMBIEN) 10 MG tablet TAKE 1 TABLET BY MOUTH AS NEEDED FOR SLEEP 30 tablet 0  . atenolol (TENORMIN) 25 MG tablet Take 1 tablet (25 mg total) by mouth daily. (Patient not taking: Reported on  07/11/2018) 90 tablet 2   No current facility-administered medications on file prior to visit.     Past Medical History:  Diagnosis Date  . Hyperlipidemia   . Hypertension     Past Surgical History:  Procedure Laterality Date  . APPENDECTOMY    . COLONOSCOPY  2003 & 2015   Dr Matthias HughsBuccini  . LAPAROSCOPIC RETROPUBIC PROSTATECTOMY  2008   Dr Laverle PatterBorden  . UPPER GI ENDOSCOPY  2015   twice    Social History   Socioeconomic History  . Marital status: Married    Spouse name: Not on file  . Number of children: Not on file  . Years of education: Not on file  . Highest education level: Not on file  Occupational History  . Not on file  Social Needs  . Financial resource strain: Not on file  . Food insecurity:    Worry: Not on file    Inability: Not on file  . Transportation needs:    Medical: Not on file    Non-medical: Not on file  Tobacco Use  . Smoking status: Former Games developermoker  . Smokeless tobacco: Never Used  Substance and Sexual Activity  . Alcohol use: Yes  . Drug use: No  . Sexual activity: Not on file  Lifestyle  . Physical activity:  Days per week: Not on file    Minutes per session: Not on file  . Stress: Not on file  Relationships  . Social connections:    Talks on phone: Not on file    Gets together: Not on file    Attends religious service: Not on file    Active member of club or organization: Not on file    Attends meetings of clubs or organizations: Not on file    Relationship status: Not on file  Other Topics Concern  . Not on file  Social History Narrative  . Not on file    Family History  Problem Relation Age of Onset  . Stroke Mother        in 47s; died of SDH  . Hypertension Mother   . Prostate cancer Father 44  . Diabetes Neg Hx   . Heart disease Neg Hx     Review of Systems  Constitutional: Negative for fever.  HENT: Negative for congestion and sinus pain.   Respiratory: Negative for cough, shortness of breath and wheezing.     Cardiovascular: Positive for palpitations. Negative for chest pain.  Neurological: Negative for light-headedness and headaches.       Objective:   Vitals:   07/11/18 1610  BP: (!) 148/82  Pulse: (!) 37  Resp: 16  Temp: 98.5 F (36.9 C)  SpO2: 97%   BP Readings from Last 3 Encounters:  07/11/18 (!) 148/82  12/31/17 116/78  10/23/16 114/82   Wt Readings from Last 3 Encounters:  07/11/18 227 lb (103 kg)  12/31/17 224 lb (101.6 kg)  10/23/16 218 lb (98.9 kg)   Body mass index is 32.57 kg/m.   Physical Exam    Constitutional: Appears well-developed and well-nourished. No distress.  HENT:  Head: Normocephalic and atraumatic.  Neck: Neck supple. No tracheal deviation present. No thyromegaly present.  No cervical lymphadenopathy Cardiovascular: Normal rate, irregular rhythm with premature beats and normal heart sounds.   No murmur heard.    No edema Pulmonary/Chest: Effort normal and breath sounds normal. No respiratory distress. No has no wheezes. No rales.  Skin: Skin is warm and dry. Not diaphoretic.        Assessment & Plan:    See Problem List for Assessment and Plan of chronic medical problems.

## 2018-07-11 NOTE — Telephone Encounter (Addendum)
Patient is experiencing palpitations today and over the weekend. Feels like it's occurring approximately every 3rd beat. Denies CP/SOB/Sweating.Called Cardiologist who scheduled a new patient appointment for 08/29/17. 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 I am speaking with his assistant, Tamela OddiBetsy. Called PCP spoke with Tammy who said he can come to the office now to see Dr. Lawerance BachBurns. Upon calling patient back, b/p 130/90, p. 52 bpm. Reports he stopped taking atenolol until 2 days ago he started taking it again.   Reason for Disposition . [1] Skipped or extra beat(s) AND [2] occurs 4 or more times per minute  Answer Assessment - Initial Assessment Questions 1. DESCRIPTION: "Please describe your heart rate or heart beat that you are having" (e.g., fast/slow, regular/irregular, skipped or extra beats, "palpitations")     2. ONSET: "When did it start?" (Minutes, hours or days)      Over the weekend 3. DURATION: "How long does it last" (e.g., seconds, minutes, hours)      4. PATTERN "Does it come and go, or has it been constant since it started?"  "Does it get worse with exertion?"   "Are you feeling it now?"     constant 5. TAP: "Using your hand, can you tap out what you are feeling on a chair or table in front of you, so that I can hear?" (Note: not all patients can do this)       Feels like he is having PVC about every other beat. 6. HEART RATE: "Can you tell me your heart rate?" "How many beats in 15 seconds?"  (Note: not all patients can do this)       Feels though it is PVC's 7. RECURRENT SYMPTOM: "Have you ever had this before?" If so, ask: "When was the last time?" and "What happened that time?"      2006 or 2007 was seen by a cardiologist. 8. CAUSE: "What do you think is causing the palpitations?"     unknown 9. CARDIAC HISTORY: "Do you have any history of heart disease?" (e.g., heart attack, angina, bypass surgery, angioplasty, arrhythmia)      Arrhythmia  and brain bleed in the past. 10. OTHER SYMPTOMS: "Do you have any other symptoms?" (e.g., dizziness, chest pain, sweating, difficulty breathing)       Has anxiety issues but is in the office today.  11. PREGNANCY: "Is there any chance you are pregnant?" "When was your last menstrual period?"       na  Protocols used: HEART RATE AND HEARTBEAT QUESTIONS-A-AH

## 2018-07-15 ENCOUNTER — Other Ambulatory Visit: Payer: Self-pay | Admitting: *Deleted

## 2018-07-15 DIAGNOSIS — I493 Ventricular premature depolarization: Secondary | ICD-10-CM

## 2018-07-15 DIAGNOSIS — R002 Palpitations: Secondary | ICD-10-CM

## 2018-07-18 ENCOUNTER — Ambulatory Visit (HOSPITAL_COMMUNITY): Payer: Managed Care, Other (non HMO) | Attending: Cardiology

## 2018-07-18 ENCOUNTER — Other Ambulatory Visit: Payer: Self-pay

## 2018-07-18 DIAGNOSIS — R002 Palpitations: Secondary | ICD-10-CM | POA: Diagnosis not present

## 2018-07-18 DIAGNOSIS — I493 Ventricular premature depolarization: Secondary | ICD-10-CM

## 2018-07-20 ENCOUNTER — Ambulatory Visit (INDEPENDENT_AMBULATORY_CARE_PROVIDER_SITE_OTHER): Payer: Managed Care, Other (non HMO) | Admitting: Cardiovascular Disease

## 2018-07-20 ENCOUNTER — Encounter: Payer: Self-pay | Admitting: Cardiovascular Disease

## 2018-07-20 VITALS — BP 150/86 | HR 78 | Ht 70.0 in | Wt 228.0 lb

## 2018-07-20 DIAGNOSIS — E559 Vitamin D deficiency, unspecified: Secondary | ICD-10-CM

## 2018-07-20 DIAGNOSIS — E669 Obesity, unspecified: Secondary | ICD-10-CM

## 2018-07-20 DIAGNOSIS — R002 Palpitations: Secondary | ICD-10-CM | POA: Diagnosis not present

## 2018-07-20 DIAGNOSIS — I10 Essential (primary) hypertension: Secondary | ICD-10-CM | POA: Diagnosis not present

## 2018-07-20 DIAGNOSIS — E7800 Pure hypercholesterolemia, unspecified: Secondary | ICD-10-CM | POA: Diagnosis not present

## 2018-07-20 DIAGNOSIS — I498 Other specified cardiac arrhythmias: Secondary | ICD-10-CM | POA: Diagnosis not present

## 2018-07-20 MED ORDER — METOPROLOL TARTRATE 50 MG PO TABS: 50.0000 mg | ORAL_TABLET | Freq: Two times a day (BID) | ORAL | 3 refills | Status: DC

## 2018-07-20 NOTE — Patient Instructions (Signed)
Medication Instructions:  Stop Atenolol  Start Metoprolol 50 mg twice daily. (for 2 days take 1/2 tablet twice daily) monitor HR if low let us know. If you need a refill on your cardiac medications before your next appointment, please call your pharmacy.   Lab work: CMET, TSH, MAG, CBC, VIT D, LIPID If you have labs (blood work) drawn today and your tests are completely normal, you will receive your results only by: Marland Kitchen. MyChart Message (if you have MyChart) OR . A paper copy in the mail If you have any lab test that is abnormal or we need to change your treatment, we will call you to review the results.  Testing/Procedures: Your physician has recommended that you wear an event monitor-14 day. Event monitors are medical devices that record the heart's electrical activity. Doctors most often us these monitors to diagnose arrhythmias. Arrhythmias are problems with the speed or rhythm of the heartbeat. The monitor is a small, portable device. You can wear one while you do your normal daily activities. This is usually used to diagnose what is causing palpitations/syncope (passing out). [facility] [address], [address].   Follow-Up: At [facility], you and your health needs are our priority.  As part of our continuing mission to provide you with exceptional heart care, we have created designated Provider Care Teams.  These Care Teams include your primary Cardiologist (physician) and Advanced Practice Providers (APPs -  Physician Assistants and Nurse Practitioners) who all work together to provide you with the care you need, when you need it. Marland Kitchen. Keep Follow up with Dr.Kelly on January 28th

## 2018-07-20 NOTE — Progress Notes (Signed)
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 Billey Gosling for evaluation of PVCs.  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 Billey Gosling for evaluation of recent ectopy development and demonstration of frequent PVCs.  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  He has a history of some anxiety for which she has taken sertraline and clonazepam.  He also has mild hyperlipidemia for which she has been on pravastatin 20 mg.  For the past 3 weeks, he has begun to notice episodes of palpitations.  His apple watch had detected PVCs.  He was seen by Dr. Billey Gosling on July 11, 2018.  Due to his recent ectopy, he was scheduled to undergo cardiology evaluation and was initially given an appointment to see another physician at the end of January.  He had called the office and requested to see if I could see him and as a result I added him onto my schedule today for assessment.  He denies any chest pain.  He denies any change in exercise tolerance recently.  He typically exercises on elliptical and tries to do some form of activity at least 5 days/week for 30 to 40 minutes if at all possible.  Despite taking atenolol 25 mg he continues to experience the palpitations.  He denies presyncope or syncope.  He is unaware of any salvos of ectopy or sustained arrhythmia.  He is stopped drinking caffeine for the last several days.  Remotely he believes he may have had an episode of atrial fibrillation which lasted for hours in 2010 but was not aware of any recurrence.  He denies any difficulty with sleep.  His sleep is restorative.  He denies  frequent nocturia.  He denies daytime sleepiness.    Prior to his evaluation, he underwent an echo Doppler study on July 18, 2018 which revealed an EF of 55 to 60%.  He had normal wall motion.  There was grade 2 diastolic dysfunction and Doppler parameters were consistent with elevated ventricular end-diastolic filling pressure.  There was trivial AR, mild MR.  His left atrium was mildly dilated.  PA pressures were normal.  He presents for evaluation.   Past Medical History:  Diagnosis Date  . Hyperlipidemia   . Hypertension     Past Surgical History:  Procedure Laterality Date  . APPENDECTOMY    . COLONOSCOPY  2003 & 2015   Dr Cristina Gong  . LAPAROSCOPIC RETROPUBIC PROSTATECTOMY  2008   Dr Alinda Money  . UPPER GI ENDOSCOPY  2015   twice    Current Medications: Outpatient Medications Prior to Visit  Medication Sig Dispense Refill  . amLODipine (NORVASC) 10 MG tablet TAKE 1 TABLET BY MOUTH EVERY DAY 90 tablet 2  . atenolol (TENORMIN) 25 MG tablet Take 1 tablet (25 mg total) by mouth daily. 90 tablet 2  . losartan (COZAAR) 100 MG tablet TAKE 1 TABLET (100 MG TOTAL) BY MOUTH DAILY. 90 tablet 2  . pravastatin (PRAVACHOL) 20 MG tablet Take 1 tablet (20 mg total) by mouth daily. 90 tablet 3  . clonazePAM (KLONOPIN) 0.5 MG  tablet TAKE 1 TABLET BY MOUTH TWICE A DAY 60 tablet 2  . RaNITidine HCl (ZANTAC PO) Take by mouth.    . sertraline (ZOLOFT) 50 MG tablet Take 1 tablet (50 mg total) by mouth daily. 90 tablet 1  . zolpidem (AMBIEN) 10 MG tablet TAKE 1 TABLET BY MOUTH AS NEEDED FOR SLEEP 30 tablet 0   No facility-administered medications prior to visit.      Allergies:   Codeine   Social History   Socioeconomic History  . Marital status: Married    Spouse name: Not on file  . Number of children: Not on file  . Years of education: Not on file  . Highest education level: Not on file  Occupational History  . Not on file  Social Needs  . Financial resource strain: Not on file  .  Food insecurity:    Worry: Not on file    Inability: Not on file  . Transportation needs:    Medical: Not on file    Non-medical: Not on file  Tobacco Use  . Smoking status: Former Research scientist (life sciences)  . Smokeless tobacco: Never Used  Substance and Sexual Activity  . Alcohol use: Yes  . Drug use: No  . Sexual activity: Not on file  Lifestyle  . Physical activity:    Days per week: Not on file    Minutes per session: Not on file  . Stress: Not on file  Relationships  . Social connections:    Talks on phone: Not on file    Gets together: Not on file    Attends religious service: Not on file    Active member of club or organization: Not on file    Attends meetings of clubs or organizations: Not on file    Relationship status: Not on file  Other Topics Concern  . Not on file  Social History Narrative  . Not on file    Socially he is married for 40 years.  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  He has 1 living daughter.  Unfortunately, his son is deceased.  He is expecting his first grandchild.  Family History:  The patient's family history includes Hypertension in his mother; Prostate cancer (age of onset: 80) in his father; Stroke in his mother.   His mother died at age 15 and had hypertension.  His father died at age 13 and had prostate CA.  He has 1 sister.  ROS General: Negative; No fevers, chills, or night sweats;  HEENT: Negative; No changes in vision or hearing, sinus congestion, difficulty swallowing Pulmonary: Negative; No cough, wheezing, shortness of breath, hemoptysis Cardiovascular: See HPI GI: Negative; No nausea, vomiting, diarrhea, or abdominal pain GU: Negative; No dysuria, hematuria, or difficulty voiding Musculoskeletal: Negative; no myalgias, joint pain, or weakness Hematologic/Oncology: Negative; no easy bruising,  bleeding Endocrine: Negative; no heat/cold intolerance; no diabetes Neuro: Negative; no changes in balance, headaches Skin: Negative; No rashes or skin lesions Psychiatric: Occasional anxiety Sleep: Negative; No snoring, daytime sleepiness, hypersomnolence, bruxism, restless legs, hypnogognic hallucinations, no cataplexy Other comprehensive 14 point system review is negative.   PHYSICAL EXAM:   VS:  BP (!) 150/86   Pulse 78   Ht _0  (1.778 m)   Wt 228 lb (103.4 kg)   BMI 32.71 kg/m     Repeat blood pressure  150/84.  Blood pressure recorded at Dr. Quay Burow evaluation was 148/82 and previously was 116/78 in May 2019  Wt Readings from Last 3 Encounters:  07/20/18 228 lb (103.4 kg)  07/11/18 227 lb (103 kg)  12/31/17 224 lb (101.6 kg)    General: Alert, oriented, no distress.  Skin: normal turgor, no rashes, warm and dry HEENT: Normocephalic, atraumatic. Pupils equal round and reactive to light; sclera anicteric; extraocular muscles intact; Fundi without hemorrhages or exudates.  Discs flat Nose without nasal septal hypertrophy Mouth/Parynx benign; Mallinpatti scale 3 Neck: No JVD, no carotid bruits; normal carotid upstroke Lungs: clear to ausculatation and percussion; no wheezing or rales Chest wall: without tenderness to palpitation Heart: PMI not displaced, frequent isolated ectopy with transient trigeminy, s1 s2 normal, 1/6 systolic murmur, no diastolic murmur, no rubs, gallops, thrills, or heaves Abdomen: soft, nontender; no hepatosplenomehaly, BS+; abdominal aorta nontender and not dilated by palpation. Back: no CVA tenderness Pulses 2+ Musculoskeletal: full range of motion, normal strength, no joint deformities Extremities: no clubbing cyanosis or edema, Homan's sign negative  Neurologic: grossly nonfocal; Cranial nerves grossly wnl Psychologic: Normal mood and affect   Studies/Labs Reviewed:   EKG:  EKG is  ordered today. ECG (independently read by me): Sinus rhythm at  78 bpm with ventricular trigeminy and evidence for VA conduction with retrograde P waves.  PR interval 180 ms, QTc interval 442 ms.  Poor anterior R wave progression.  Possible left atrial enlargement.  Recent Labs: BMP Latest Ref Rng & Units 12/31/2017 10/23/2016 08/21/2013  Glucose 70 - 99 mg/dL 124(H) 108(H) 101(H)  BUN 6 - 23 mg/dL _0 Creatinine 0.40 - 1.50 mg/dL 1.05 1.02 0.9  Sodium 135 - 145 mEq/L 140 139 137  Potassium 3.5 - 5.1 mEq/L 4.6 4.7 3.8  Chloride 96 - 112 mEq/L 106 105 103  CO2 19 - 32 mEq/L _1 Calcium 8.4 - 10.5 mg/dL 9.6 10.0 9.5     Hepatic Function Latest Ref Rng & Units 12/31/2017 10/23/2016 08/21/2013  Total Protein 6.0 - 8.3 g/dL 8.0 7.8 8.3  Albumin 3.5 - 5.2 g/dL 4.5 4.6 4.5  AST 0 - 37 U/L 29 33 61(H)  ALT 0 - 53 U/L 32 29 88(H)  Alk Phosphatase 39 - 117 U/L 67 52 58  Total Bilirubin 0.2 - 1.2 mg/dL 0.6 1.0 1.0  Bilirubin, Direct 0.0 - 0.3 mg/dL - - 0.1    CBC Latest Ref Rng & Units 12/31/2017 10/23/2016 08/21/2013  WBC 4.0 - 10.5 K/uL 6.7 7.3 7.4  Hemoglobin 13.0 - 17.0 g/dL 13.8 14.6 14.3  Hematocrit 39.0 - 52.0 % 41.1 42.9 42.5  Platelets 150.0 - 400.0 K/uL 260.0 274.0 278.0   Lab Results  Component Value Date   MCV 94.2 12/31/2017   MCV 94.5 10/23/2016   MCV 96.1 08/21/2013   Lab Results  Component Value Date   TSH 0.91 12/31/2017   Lab Results  Component Value Date   HGBA1C 5.7 01/03/2018     BNP No results found for: BNP  ProBNP No results found for: PROBNP   Lipid Panel     Component Value Date/Time   CHOL 228 (H) 12/31/2017 0858   TRIG 64.0 12/31/2017 0858   HDL 80.60 12/31/2017 0858   CHOLHDL 3 12/31/2017 0858   VLDL 12.8 12/31/2017 0858   LDLCALC 135 (H) 12/31/2017 0858   LDLDIRECT 161.2 12/29/2007 0000     RADIOLOGY: No results found.   Additional studies/ records that were  reviewed today include:    07/18/2018 ECHO Study Conclusions  - Left ventricle: The cavity size was normal. Systolic function  was   normal. The estimated ejection fraction was in the range of 55%   to 60%. Wall motion was normal; there were no regional wall   motion abnormalities. Features are consistent with a pseudonormal   left ventricular filling pattern, with concomitant abnormal   relaxation and increased filling pressure (grade 2 diastolic   dysfunction). Doppler parameters are consistent with elevated   ventricular end-diastolic filling pressure. - Aortic valve: There was trivial regurgitation. - Mitral valve: There was mild regurgitation. - Left atrium: The atrium was mildly dilated. - Right ventricle: Systolic function was normal. - Right atrium: The atrium was normal in size. - Pulmonary arteries: Systolic pressure was within the normal   range. - Inferior vena cava: The vessel was normal in size. - Pericardium, extracardiac: There was no pericardial effusion.   ASSESSMENT:    No diagnosis found.   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  For the past 3 weeks he is become aware of frequent ectopy which he feels and has been documented to be frequent PVCs.  He has been documented to have a bigeminal as well as trigeminal pattern intermittently.  He has remained fairly active.  His palpitations did not improve when he held his atenolol for several days and therefore resumed atenolol without significant benefit.  His ECG today shows sinus rhythm at 78 bpm with ventricular bigeminy and evidence for VA conduction with retrograde P waves post PVC.  I reviewed his echo Doppler study in detail which reveals normal systolic function but suggest grade 2 diastolic dysfunction and abnormal tissue Doppler.  There is only mild mitral regurgitation and his left atrium was mildly dilated.  I reviewed recent laboratory.  TSH was  0.91.  6 months ago there was evidence for vitamin D insufficiency.  In addition, lipid studies were elevated with a total cholesterol 228 and LDL cholesterol 135 despite taking pravastatin 20 mg.  I have suggested repeat laboratory to be done in the fasting state including a C met, TSH, magnesium level, CBC, vitamin D level, and lipid panel.  He will obtain these labs through his office.  I have recommended he discontinue atenolol and for the next 2 days initiate metoprolol tartrate at 25 mg twice a day and then titrate to 50 mg twice a day.  I am schedule him for a 14-day event monitor.  I had a  discussion with him regarding need for probable more aggressive treatment to his lipid status and reviewed with him data regarding subclinical atherosclerosis and potential regression of plaque with aggressive treatment.  Presently, he denies any awareness of obstructive sleep apnea.  He believes he is sleeping well.  I will see him in several weeks in follow-up of the above studies and further recommendations will be made at that time.  Medication Adjustments/Labs and Tests Ordered: Current medicines are reviewed at length with the patient today.  Concerns regarding medicines are outlined above.  Medication changes, Labs and Tests ordered today are listed in the Patient Instructions below. There are no Patient Instructions on file for this visit.   Signed, Shelva Majestic, MD  07/20/2018 3:20 PM    [facility]  [address], [place], [place], [place]  [address] Phone: [phone]

## 2018-07-21 ENCOUNTER — Ambulatory Visit (INDEPENDENT_AMBULATORY_CARE_PROVIDER_SITE_OTHER): Payer: Managed Care, Other (non HMO)

## 2018-07-21 DIAGNOSIS — R002 Palpitations: Secondary | ICD-10-CM | POA: Diagnosis not present

## 2018-07-21 DIAGNOSIS — I498 Other specified cardiac arrhythmias: Secondary | ICD-10-CM

## 2018-07-25 ENCOUNTER — Encounter: Payer: Self-pay | Admitting: Cardiovascular Disease

## 2018-08-09 ENCOUNTER — Other Ambulatory Visit: Payer: Self-pay | Admitting: Internal Medicine

## 2018-08-09 MED ORDER — METOPROLOL TARTRATE 50 MG PO TABS: 75.0000 mg | ORAL_TABLET | Freq: Two times a day (BID) | ORAL | 3 refills | Status: DC

## 2018-08-26 DIAGNOSIS — H903 Sensorineural hearing loss, bilateral: Secondary | ICD-10-CM | POA: Diagnosis not present

## 2018-08-29 ENCOUNTER — Ambulatory Visit: Payer: Managed Care, Other (non HMO) | Admitting: Cardiovascular Disease

## 2018-08-30 ENCOUNTER — Ambulatory Visit (INDEPENDENT_AMBULATORY_CARE_PROVIDER_SITE_OTHER): Payer: Managed Care, Other (non HMO) | Admitting: Cardiovascular Disease

## 2018-08-30 ENCOUNTER — Encounter: Payer: Self-pay | Admitting: Cardiovascular Disease

## 2018-08-30 VITALS — BP 118/80 | HR 62 | Ht 70.0 in | Wt 226.0 lb

## 2018-08-30 DIAGNOSIS — E669 Obesity, unspecified: Secondary | ICD-10-CM

## 2018-08-30 DIAGNOSIS — E7800 Pure hypercholesterolemia, unspecified: Secondary | ICD-10-CM

## 2018-08-30 DIAGNOSIS — E559 Vitamin D deficiency, unspecified: Secondary | ICD-10-CM

## 2018-08-30 DIAGNOSIS — I10 Essential (primary) hypertension: Secondary | ICD-10-CM

## 2018-08-30 DIAGNOSIS — I5189 Other ill-defined heart diseases: Secondary | ICD-10-CM

## 2018-08-30 DIAGNOSIS — I519 Heart disease, unspecified: Secondary | ICD-10-CM

## 2018-08-30 DIAGNOSIS — I493 Ventricular premature depolarization: Secondary | ICD-10-CM

## 2018-08-30 MED ORDER — SPIRONOLACTONE 25 MG PO TABS: 12.5000 mg | ORAL_TABLET | Freq: Every day | ORAL | 12 refills | Status: DC

## 2018-08-30 NOTE — Progress Notes (Signed)
Cardiology Office Note    Date:  08/30/2018   ID:  Juan Heys, MD, DOB [birth year], MRN [id number]  PCP:  Binnie Rail, MD  Cardiologist:  Shelva Majestic, MD   F/U cardiology evaluation referred through the courtesy of Dr. Billey Gosling for evaluation of PVCs.  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 Billey Gosling for evaluation of recent ectopy development and demonstration of frequent PVCs.  I saw him for initial evaluation on July 20, 2018.  He presents for follow-up evaluation.  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  He has a history of some anxiety for which she has taken sertraline and clonazepam.  He also has mild hyperlipidemia for which she has been on pravastatin 20 mg.  For the past 3 weeks, he has begun to notice episodes of palpitations.  His apple watch had detected PVCs.  He was seen by Dr. Billey Gosling on July 11, 2018.  Due to his recent ectopy, he was scheduled to undergo cardiology evaluation and was initially given an appointment to see another physician at the end of January.  He had called the office and requested to see if I could see him and as a result I added him onto my schedule today for assessment.  He denies any chest pain.  He denies any change in exercise tolerance recently.  He typically exercises on elliptical and tries to do some form of activity at least 5 days/week for 30 to 40 minutes if at all possible.  Despite taking atenolol 25 mg he continues to experience the palpitations.  He denies presyncope or syncope.  He is unaware of any salvos of ectopy or sustained arrhythmia.  He is stopped drinking caffeine for the last several days.  Remotely he believes he may have had an episode of atrial fibrillation which lasted for hours in 2010 but was not aware of any  recurrence.  He denies any difficulty with sleep.  His sleep is restorative.  He denies frequent nocturia.  He denies daytime sleepiness.    Prior to his initial evaluation, he underwent an echo Doppler study on July 18, 2018 which revealed an EF of 55 to 60%.  He had normal wall motion.  There was grade 2 diastolic dysfunction and Doppler parameters were consistent with elevated ventricular end-diastolic filling pressure.  There was trivial AR, mild MR.  His left atrium was mildly dilated.  PA pressures were normal.    When I initially saw him on July 20, 2018, his ECG showed sinus rhythm at 78 with ventricular bigeminy and evidence for VA conduction with retrograde P waves post PVC.  I reviewed his echo Doppler study in detail which showed normal systolic function but grade 2 diastolic dysfunction and abnormal tissue Doppler.  I reviewed his prior lipid studies which had shown an LDL cholesterol of 135 with total cholesterol 228 and suggested follow-up laboratory be obtained.  I recommended that he discontinue atenolol and initiated metoprolol 25 mg twice a day for 2 days with increase to 50 mg twice a day.  I scheduled him for a 2-week event monitor.  He was monitored from July 21, 2018 through August 03, 2018.  Average heart rate was 75 bpm.  Throughout the monitoring period despite taking metoprolol 50 twice  daily he had isolated PVCs with both bigeminy and had periods where he had up to 16 PVCs and 1 minute and on another occasion 28 PVCs and 1 minute and also had episodes of trigeminal rhythm.  He did not have any ventricular couplets.  There were no episodes of nonsustained VT.  All PVCs were isolated.  His maximal heart rate was sinus tachycardia at 139 bpm which occurred while he was exercising and his slowest heart rate was sinus bradycardia at 50 bpm.  As result, we contacted him to further increase metoprolol to 75 mg twice a day.  He subsequently underwent repeat laboratory at his  office.  LDL cholesterol was improved at 100 with total cholesterol 225 triglycerides 91 and HDL was excellent at 106.  Magnesium was 2.0.  He had very mild transaminase elevation with AST at 44 and ALT at 52.  Vitamin D level was low at 15 and he subsequently has initiated vitamin D replacement.  Creatinine was 1.03 with a potassium of 4.3.  Since being on the increased metoprolol dose he has noticed improvement in his ectopy and over the past 4 days this seems to have resolved.  He denies any episodes of chest pain or tightness.  He is concerned about his grade 2 diastolic dysfunction.  He presents for reevaluation.  Past Medical History:  Diagnosis Date  . Hyperlipidemia   . Hypertension     Past Surgical History:  Procedure Laterality Date  . APPENDECTOMY    . COLONOSCOPY  2003 & 2015   Dr Cristina Gong  . LAPAROSCOPIC RETROPUBIC PROSTATECTOMY  2008   Dr Alinda Money  . UPPER GI ENDOSCOPY  2015   twice    Current Medications: Outpatient Medications Prior to Visit  Medication Sig Dispense Refill  . amLODipine (NORVASC) 10 MG tablet TAKE 1 TABLET BY MOUTH EVERY DAY 90 tablet 2  . losartan (COZAAR) 100 MG tablet TAKE 1 TABLET (100 MG TOTAL) BY MOUTH DAILY. 90 tablet 2  . metoprolol tartrate (LOPRESSOR) 50 MG tablet Take 1.5 tablets (75 mg total) by mouth 2 (two) times daily. 180 tablet 3  . pravastatin (PRAVACHOL) 20 MG tablet TAKE 1 TABLET BY MOUTH EVERY DAY 90 tablet 0   No facility-administered medications prior to visit.      Allergies:   Codeine   Social History   Socioeconomic History  . Marital status: Married    Spouse name: Not on file  . Number of children: Not on file  . Years of education: Not on file  . Highest education level: Not on file  Occupational History  . Not on file  Social Needs  . Financial resource strain: Not on file  . Food insecurity:    Worry: Not on file    Inability: Not on file  . Transportation needs:    Medical: Not on file    Non-medical:  Not on file  Tobacco Use  . Smoking status: Former Research scientist (life sciences)  . Smokeless tobacco: Never Used  Substance and Sexual Activity  . Alcohol use: Yes  . Drug use: No  . Sexual activity: Not on file  Lifestyle  . Physical activity:    Days per week: Not on file    Minutes per session: Not on file  . Stress: Not on file  Relationships  . Social connections:    Talks on phone: Not on file    Gets together: Not on file    Attends religious service: Not on file    Active  member of club or organization: Not on file    Attends meetings of clubs or organizations: Not on file    Relationship status: Not on file  Other Topics Concern  . Not on file  Social History Narrative  . Not on file    Socially he is married for 40 years.  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  He has 1 living daughter.  Unfortunately, his son is deceased.  He is expecting his first grandchild.  Family History:  The patient's family history includes Hypertension in his mother; Prostate cancer (age of onset: 43) in his father; Stroke in his mother.   His mother died at age 37 and had hypertension.  His father died at age 2 and had prostate CA.  He has 1 sister.  ROS General: Negative; No fevers, chills, or night sweats;  HEENT: Negative; No changes in vision or hearing, sinus congestion, difficulty swallowing Pulmonary: Negative; No cough, wheezing, shortness of breath, hemoptysis Cardiovascular: See HPI GI: Negative; No nausea, vomiting, diarrhea, or abdominal pain GU: Negative; No dysuria, hematuria, or difficulty voiding Musculoskeletal: Negative; no myalgias, joint pain, or weakness Hematologic/Oncology: Negative; no easy bruising, bleeding Endocrine: Negative; no heat/cold intolerance; no diabetes Neuro: Negative; no changes in balance, headaches Skin: Negative; No  rashes or skin lesions Psychiatric: Occasional anxiety Sleep: Negative; No snoring, daytime sleepiness, hypersomnolence, bruxism, restless legs, hypnogognic hallucinations, no cataplexy Other comprehensive 14 point system review is negative.   PHYSICAL EXAM:   VS:  BP 118/80   Pulse 62   Ht '5\' 10"'$  (1.778 m)   Wt 226 lb (102.5 kg)   SpO2 94%   BMI 32.43 kg/m     Repeat blood pressure by me was 130/80 supine and 138/80 standing  Wt Readings from Last 3 Encounters:  08/30/18 226 lb (102.5 kg)  07/20/18 228 lb (103.4 kg)  07/11/18 227 lb (103 kg)    General: Alert, oriented, no distress.  Skin: normal turgor, no rashes, warm and dry HEENT: Normocephalic, atraumatic. Pupils equal round and reactive to light; sclera anicteric; extraocular muscles intact;  Nose without nasal septal hypertrophy Mouth/Parynx benign; Mallinpatti scale 3 Neck: No JVD, no carotid bruits; normal carotid upstroke Lungs: clear to ausculatation and percussion; no wheezing or rales Chest wall: without tenderness to palpitation Heart: PMI not displaced, resolution of prior frequent ectopy, s1 s2 normal, 1/6 systolic murmur, no diastolic murmur, no rubs, gallops, thrills, or heaves Abdomen: soft, nontender; no hepatosplenomehaly, BS+; abdominal aorta nontender and not dilated by palpation. Back: no CVA tenderness Pulses 2+ Musculoskeletal: full range of motion, normal strength, no joint deformities Extremities: no clubbing cyanosis or edema, Homan's sign negative  Neurologic: grossly nonfocal; Cranial nerves grossly wnl Psychologic: Normal mood and affect   Studies/Labs Reviewed:   EKG:  EKG is  ordered today. ECG (independently read by me): Normal sinus rhythm at 63 bpm.  No ectopy.  PR of 182 ms, QTc interval 421 ms.  Poor anterior R wave progression.  July 20, 2018 ECG (independently read by me): Sinus rhythm at 78 bpm with ventricular trigeminy and evidence for VA conduction with retrograde P waves.   PR interval 180 ms, QTc interval 442 ms.  Poor anterior R wave progression.  Possible left atrial enlargement.  Recent Labs: BMP Latest Ref Rng & Units 12/31/2017 10/23/2016 08/21/2013  Glucose 70 - 99 mg/dL 124(H) 108(H) 101(H)  BUN 6 - 23 mg/dL '18 19 14  '$ Creatinine 0.40 - 1.50 mg/dL 1.05 1.02 0.9  Sodium 135 - 145 mEq/L 140 139 137  Potassium 3.5 - 5.1 mEq/L 4.6 4.7 3.8  Chloride 96 - 112 mEq/L 106 105 103  CO2 19 - 32 mEq/L '26 25 24  '$ Calcium 8.4 - 10.5 mg/dL 9.6 10.0 9.5     Hepatic Function Latest Ref Rng & Units 12/31/2017 10/23/2016 08/21/2013  Total Protein 6.0 - 8.3 g/dL 8.0 7.8 8.3  Albumin 3.5 - 5.2 g/dL 4.5 4.6 4.5  AST 0 - 37 U/L 29 33 61(H)  ALT 0 - 53 U/L 32 29 88(H)  Alk Phosphatase 39 - 117 U/L 67 52 58  Total Bilirubin 0.2 - 1.2 mg/dL 0.6 1.0 1.0  Bilirubin, Direct 0.0 - 0.3 mg/dL - - 0.1    CBC Latest Ref Rng & Units 12/31/2017 10/23/2016 08/21/2013  WBC 4.0 - 10.5 K/uL 6.7 7.3 7.4  Hemoglobin 13.0 - 17.0 g/dL 13.8 14.6 14.3  Hematocrit 39.0 - 52.0 % 41.1 42.9 42.5  Platelets 150.0 - 400.0 K/uL 260.0 274.0 278.0   Lab Results  Component Value Date   MCV 94.2 12/31/2017   MCV 94.5 10/23/2016   MCV 96.1 08/21/2013   Lab Results  Component Value Date   TSH 0.91 12/31/2017   Lab Results  Component Value Date   HGBA1C 5.7 01/03/2018     BNP No results found for: BNP  ProBNP No results found for: PROBNP   Lipid Panel     Component Value Date/Time   CHOL 228 (H) 12/31/2017 0858   TRIG 64.0 12/31/2017 0858   HDL 80.60 12/31/2017 0858   CHOLHDL 3 12/31/2017 0858   VLDL 12.8 12/31/2017 0858   LDLCALC 135 (H) 12/31/2017 0858   LDLDIRECT 161.2 12/29/2007 0000     RADIOLOGY: No results found.   Additional studies/ records that were reviewed today include:  I again have reviewed his prior echo Doppler study.  I extensively reviewed his 2-week event monitor as well as laboratory which he had obtained at his office.   07/18/2018 ECHO Study  Conclusions  - Left ventricle: The cavity size was normal. Systolic function was   normal. The estimated ejection fraction was in the range of 55%   to 60%. Wall motion was normal; there were no regional wall   motion abnormalities. Features are consistent with a pseudonormal   left ventricular filling pattern, with concomitant abnormal   relaxation and increased filling pressure (grade 2 diastolic   dysfunction). Doppler parameters are consistent with elevated   ventricular end-diastolic filling pressure. - Aortic valve: There was trivial regurgitation. - Mitral valve: There was mild regurgitation. - Left atrium: The atrium was mildly dilated. - Right ventricle: Systolic function was normal. - Right atrium: The atrium was normal in size. - Pulmonary arteries: Systolic pressure was within the normal   range. - Inferior vena cava: The vessel was normal in size. - Pericardium, extracardiac: There was no pericardial effusion.   ASSESSMENT:    1. Ventricular ectopy with PVC, bigeminy and trigeminy; improved with titration of metoprolol   2. Essential hypertension   3. Grade II diastolic dysfunction   4. Pure hypercholesterolemia   5. Vitamin D deficiency   6. 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  I initially saw him in December he had experienced 3 weeks of  frequent ectopy which he feels and has been documented to be frequent PVCs.  He has been documented to have a bigeminal as well as trigeminal pattern intermittently.  He has remained fairly active.  His palpitations did not improve when he held his atenolol for several days and therefore resumed atenolol without significant benefit.  His ECG at that time showed sinus rhythm with frequent ventricular bigeminy with VA conduction  with retrograde P waves post PVC.  I had extensively reviewed his echo Doppler study which showed normal systolic function with grade 2 diastolic dysfunction and abnormal tissue Doppler suggesting increased LA filling pressure.  His 2-week event monitor continues to show frequent ventricular ectopy with bigeminy and trigeminy despite being changed to metoprolol 50 mg twice a day with discontinuance of atenolol.  However, since his dose was further titrated to 75 mg twice a day his ectopy is significantly improved and essentially resolved.  He admits to a longstanding hypertensive history and in the past also had a venous cerebral bleed she believes may have been related to significant hypertension.  His blood pressure today is improved on his increased metoprolol dose but still remains mildly elevated based on new hypertensive guidelines on recheck by me with blood pressure 138/80 standing without orthostatic change.  With his grade 2 diastolic dysfunction I have recommended the addition of low-dose spironolactone at 12.5 mg daily to his medical regimen.  Target blood pressure ideally is less than 120/80 currently with his diastolic dysfunction and potential HFpEF.  He is now on pravastatin for hyperlipidemia.  His LDL cholesterol was 100 which was improved from his previous level of 135.  His recent laboratory has shown mild transaminase elevation and for this reason I will not increase his statin dose presently.  We discussed CAD screening with coronary calcium score this will be scheduled to be done at [facility] office when his schedule allows.  I have recommended that after several weeks of initiating low-dose spironolactone that he obtain a basic metabolic panel at his office to make certain his potassium and renal function remain stable.  He will monitor his blood pressure.  I will see him in 3 months for reevaluation or sooner if problems arise.   Medication Adjustments/Labs and Tests Ordered: Current  medicines are reviewed at length with the patient today.  Concerns regarding medicines are outlined above.  Medication changes, Labs and Tests ordered today are listed in the Patient Instructions below. Patient Instructions  Medication Instructions:  START SPIRONOLACTONE 12.'5MG'$  (1/2 TAB) DAILY If you need a refill on your cardiac medications before your next appointment, please call your pharmacy.  Labwork: NONE  When you have your labs (blood work) drawn today and your tests are completely normal, you will receive your results only by MyChart Message (if you have MyChart) -OR-  A paper copy in the mail.  If you have any lab test that is abnormal or we need to change your treatment, we will call you to review these results.  Testing/Procedures: CALCIUM SCORE  Follow-Up: You will need a follow up appointment in 3 months.   You may see Shelva Majestic, MD or one of the following Advanced Practice Providers on your designated Care Team:  Almyra Deforest, PA-C  Fabian Sharp, PA-C    At [facility], you and your health needs  are our priority.  As part of our continuing mission to provide you with exceptional heart care, we have created designated Provider Care Teams.  These Care Teams include your primary Cardiologist (physician) and Advanced Practice Providers (APPs -  Physician Assistants and Nurse Practitioners) who all work together to provide you with the care you need, when you need it.  Thank you for choosing [facility] at [facility]!!        Signed, Shelva Majestic, MD  08/30/2018 6:45 PM    [place] [address] [place], [place], [place]  [address] Phone: [phone]

## 2018-08-30 NOTE — Patient Instructions (Signed)
Medication Instructions:  START SPIRONOLACTONE 12.5MG  (1/2 TAB) DAILY If you need a refill on your cardiac medications before your next appointment, please call your pharmacy.  Labwork: NONE  When you have your labs (blood work) drawn today and your tests are completely normal, you will receive your results only by MyChart Message (if you have MyChart) -OR-  A paper copy in the mail.  If you have any lab test that is abnormal or we need to change your treatment, we will call you to review these results.  Testing/Procedures: CALCIUM SCORE  Follow-Up: You will need a follow up appointment in 3 months.   You may see Nicki Guadalajara, MD or one of the following Advanced Practice Providers on your designated Care Team:  Azalee Course, PA-C  Micah Flesher, PA-C    At [facility], you and your health needs are our priority.  As part of our continuing mission to provide you with exceptional heart care, we have created designated Provider Care Teams.  These Care Teams include your primary Cardiologist (physician) and Advanced Practice Providers (APPs -  Physician Assistants and Nurse Practitioners) who all work together to provide you with the care you need, when you need it.  Thank you for choosing [facility] at [facility]!!

## 2018-08-31 NOTE — Addendum Note (Signed)
Addended by: TRUITT, ANGELA M on: 08/31/2018 04:26 PM   Modules accepted: Orders  

## 2018-09-15 ENCOUNTER — Ambulatory Visit (INDEPENDENT_AMBULATORY_CARE_PROVIDER_SITE_OTHER)
Admission: RE | Admit: 2018-09-15 | Discharge: 2018-09-15 | Disposition: A | Discharge status: Home / Self Care | Payer: Self-pay | Source: Ambulatory Visit | Attending: Cardiovascular Disease | Admitting: Cardiovascular Disease

## 2018-09-15 DIAGNOSIS — I493 Ventricular premature depolarization: Secondary | ICD-10-CM

## 2018-09-21 DIAGNOSIS — H9113 Presbycusis, bilateral: Secondary | ICD-10-CM | POA: Diagnosis not present

## 2018-09-21 MED ORDER — METOPROLOL TARTRATE 50 MG PO TABS: 75.0000 mg | ORAL_TABLET | Freq: Two times a day (BID) | ORAL | 3 refills | Status: DC

## 2018-09-23 DIAGNOSIS — H9113 Presbycusis, bilateral: Secondary | ICD-10-CM | POA: Insufficient documentation

## 2018-11-07 ENCOUNTER — Other Ambulatory Visit: Payer: Self-pay | Admitting: Internal Medicine

## 2018-11-11 ENCOUNTER — Other Ambulatory Visit: Payer: Self-pay | Admitting: Internal Medicine

## 2018-11-11 DIAGNOSIS — F4329 Adjustment disorder with other symptoms: Secondary | ICD-10-CM

## 2018-11-23 ENCOUNTER — Telehealth: Payer: Self-pay | Admitting: Cardiovascular Disease

## 2018-11-23 NOTE — Telephone Encounter (Signed)
Mychart, smartphone, Pre reg complete 11/23/18 AF

## 2018-11-23 NOTE — Telephone Encounter (Signed)
Virtual Visit Pre-Appointment Phone Call  "(Name), I am calling you today to discuss your upcoming appointment. We are currently trying to limit exposure to the virus that causes COVID-19 by seeing patients at home rather than in the office."  1. "What is the BEST phone number to call the day of the visit?" - include this in appointment notes  2. Do you have or have access to (through a family member/friend) a smartphone with video capability that we can use for your visit?" a. If yes - list this number in appt notes as cell (if different from BEST phone #) and list the appointment type as a VIDEO visit in appointment notes b. If no - list the appointment type as a PHONE visit in appointment notes  3. Confirm consent - "In the setting of the current Covid19 crisis, you are scheduled for a (phone or video) visit with your provider on (date) at (time).  Just as we do with many in-office visits, in order for you to participate in this visit, we must obtain consent.  If you'd like, I can send this to your mychart (if signed up) or email for you to review.  Otherwise, I can obtain your verbal consent now.  All virtual visits are billed to your insurance company just like a normal visit would be.  By agreeing to a virtual visit, we'd like you to understand that the technology does not allow for your provider to perform an examination, and thus may limit your provider's ability to fully assess your condition. If your provider identifies any concerns that need to be evaluated in person, we will make arrangements to do so.  Finally, though the technology is pretty good, we cannot assure that it will always work on either your or our end, and in the setting of a video visit, we may have to convert it to a phone-only visit.  In either situation, we cannot ensure that we have a secure connection.  Are you willing to proceed?" STAFF: Did the patient verbally acknowledge consent to telehealth visit? Document  YES/NO here: Yes  4. Advise patient to be prepared - "Two hours prior to your appointment, go ahead and check your blood pressure, pulse, oxygen saturation, and your weight (if you have the equipment to check those) and write them all down. When your visit starts, your provider will ask you for this information. If you have an Apple Watch or Kardia device, please plan to have heart rate information ready on the day of your appointment. Please have a pen and paper handy nearby the day of the visit as well."  5. Give patient instructions for MyChart download to smartphone OR Doximity/[email or web address] as below if video visit (depending on what platform provider is using)  6. 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 I spoke with the patient via phone to ensure availability of phone/video source, confirm preferred email & phone number, and discuss instructions and expectations.  I reminded Juan Balding, MD to be prepared with any vital sign and/or heart rhythm information that could potentially be obtained via home monitoring, at the time of his visit. I reminded Juan Balding, MD to expect a phone  call prior to his visit.  Evans Lanceaylor W Missael Ferrari 11/23/2018 2:52 PM  Pt stated he would like to do a telephone visit with Dr. Tresa EndoKelly tomorrow at 4:20 pm. He stated he will find a bp cuff and does have a weight scale.  IF USING DOXIMITY or DOXY.ME - The patient will receive a link just prior to their visit by text.     FULL LENGTH CONSENT FOR TELE-HEALTH VISIT   I hereby voluntarily request, consent and authorize [facility] and its employed or contracted physicians, physician assistants, nurse practitioners or other  licensed health care professionals (the Practitioner), to provide me with telemedicine health care services (the Services") as deemed necessary by the treating Practitioner. I acknowledge and consent to receive the Services by the Practitioner via telemedicine. I understand that the telemedicine visit will involve communicating with the Practitioner through live audiovisual communication technology and the disclosure of certain medical information by electronic transmission. I acknowledge that I have been given the opportunity to request an in-person assessment or other available alternative prior to the telemedicine visit and am voluntarily participating in the telemedicine visit.  I understand that I have the right to withhold or withdraw my consent to the use of telemedicine in the course of my care at any time, without affecting my right to future care or treatment, and that the Practitioner or I may terminate the telemedicine visit at any time. I understand that I have the right to inspect all information obtained and/or recorded in the course of the telemedicine visit and may receive copies of available information for a reasonable fee.  I understand that some of the potential risks of receiving the Services via telemedicine include:   Delay or interruption in medical evaluation due to technological equipment failure or disruption;  Information transmitted may not be sufficient (e.g. poor resolution of images) to allow for appropriate medical decision making by the Practitioner; and/or   In rare instances, security protocols could fail, causing a breach of personal health information.  Furthermore, I acknowledge that it is my responsibility to provide information about my medical history, conditions and care that is complete and accurate to the best of my ability. I acknowledge that Practitioner's advice, recommendations, and/or decision may be based on factors not within their control, such as  incomplete or inaccurate data provided by me or distortions of diagnostic images or specimens that may result from electronic transmissions. I understand that the practice of medicine is not an exact science and that Practitioner makes no warranties or guarantees regarding treatment outcomes. I acknowledge that I will receive a copy of this consent concurrently upon execution via email to the email address I last provided but may also request a printed copy by calling the office of [facility].    I understand that my insurance will be billed for this visit.   I have read or had this consent read to me.  I understand the contents of this consent, which adequately explains the benefits and risks of the Services being provided via telemedicine.   I have been provided ample opportunity to ask questions regarding this consent and the Services and have had my questions answered to my satisfaction.  I give my informed consent for the services to be provided through the use of telemedicine in my medical care  By participating in this telemedicine visit I agree to the above.

## 2018-11-24 ENCOUNTER — Telehealth (INDEPENDENT_AMBULATORY_CARE_PROVIDER_SITE_OTHER): Payer: BLUE CROSS/BLUE SHIELD | Admitting: Cardiovascular Disease

## 2018-11-24 VITALS — BP 116/75 | HR 53 | Ht 70.0 in | Wt 217.0 lb

## 2018-11-24 DIAGNOSIS — E669 Obesity, unspecified: Secondary | ICD-10-CM

## 2018-11-24 DIAGNOSIS — R002 Palpitations: Secondary | ICD-10-CM

## 2018-11-24 DIAGNOSIS — I5189 Other ill-defined heart diseases: Secondary | ICD-10-CM

## 2018-11-24 DIAGNOSIS — I493 Ventricular premature depolarization: Secondary | ICD-10-CM | POA: Diagnosis not present

## 2018-11-24 DIAGNOSIS — I519 Heart disease, unspecified: Secondary | ICD-10-CM

## 2018-11-24 DIAGNOSIS — I10 Essential (primary) hypertension: Secondary | ICD-10-CM

## 2018-11-24 DIAGNOSIS — E7800 Pure hypercholesterolemia, unspecified: Secondary | ICD-10-CM

## 2018-11-24 MED ORDER — ROSUVASTATIN CALCIUM 10 MG PO TABS: 10.0000 mg | ORAL_TABLET | Freq: Every day | ORAL | 2 refills | Status: DC

## 2018-11-24 MED ORDER — SPIRONOLACTONE 25 MG PO TABS: 12.5000 mg | ORAL_TABLET | Freq: Two times a day (BID) | ORAL | 5 refills | Status: DC

## 2018-11-24 NOTE — Patient Instructions (Signed)
Medication Instructions:  Stop Pravastatin  Start Rosuvastatin 10 mg daily. Increase Spironolactone to 12.5 twice daily. (Monitor BP) Also may use extra 0.5 tablet of Metoprolol if palpitations occur.  If you need a refill on your cardiac medications before your next appointment, please call your pharmacy.   Lab work: Have blood work completed at your office in 6 weeks. If you have labs (blood work) drawn today and your tests are completely normal, you will receive your results only by: Marland Kitchen MyChart Message (if you have MyChart) OR . A paper copy in the mail If you have any lab test that is abnormal or we need to change your treatment, we will call you to review the results  Follow-Up: At [facility], you and your health needs are our priority.  As part of our continuing mission to provide you with exceptional heart care, we have created designated Provider Care Teams.  These Care Teams include your primary Cardiologist (physician) and Advanced Practice Providers (APPs -  Physician Assistants and Nurse Practitioners) who all work together to provide you with the care you need, when you need it. You will need a follow up appointment in 3 months.  Please call our office 2 months in advance to schedule this appointment.  You may see Nicki Guadalajara, MD or one of the following Advanced Practice Providers on your designated Care Team: [place], [place] . Micah Flesher, PA-C

## 2018-11-24 NOTE — Progress Notes (Signed)
Virtual Visit via Telephone Note   This visit type was conducted due to national recommendations for restrictions regarding the COVID-19 Pandemic (e.g. social distancing) in an effort to limit this patient's exposure and mitigate transmission in our community.  Due to his co-morbid illnesses, this patient is at least at moderate risk for complications without adequate follow up.  This format is felt to be most appropriate for this patient at this time.  Several attempts were made to have this be a video conference.  However, despite the patient getting the text and responding no visualization was present from his phone and due to the technical difficulties with video the encounter was transitioned to audio format only (telephone).  All issues noted in this document were discussed and addressed.  No physical exam could be performed with this format.  Please refer to the patient's chart for his  consent to telehealth for [facility].   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  He has a history of some anxiety for which he has taken sertraline and clonazepam.  He also has mild hyperlipidemia for which she has been on pravastatin 20 mg.  In December 2019, he began to notice palpitations.  His apple watch had detected PVCs.  He was seen by Dr. Cheryll Cockayne on July 11, 2018.  Due to his recent ectopy, he was scheduled to undergo cardiology evaluation and  was initially given an appointment to see another physician at the end of January.  He had called the office and requested to see if I could see him and as a result I added him onto my schedule today for assessment.  When I saw him for initial evaluation in December 2019 he denied any episodes of chest pain or change in exercise tolerance.  He typically exercises on elliptical and tries to do some form of activity at least 5 days/week for 30 to 40 minutes if at all possible.  Despite taking atenolol 25 mg he continued to experience the palpitations. He denied presyncope or syncope.  He was unaware of any salvos of ectopy or sustained arrhythmia.  He is stopped drinking caffeine for the last several days.  Remotely he believes that he may have had an episode of atrial fibrillation which lasted for hours in 2010 but was not aware of any recurrence.  He denies any difficulty with sleep.  His sleep is restorative.  He denies frequent nocturia.  He denies daytime sleepiness.    Prior to his initial evaluation, he underwent an echo Doppler study on July 18, 2018 which revealed an EF of 55 to 60%.  He had normal wall motion.  There was grade 2 diastolic dysfunction and Doppler parameters were consistent with elevated ventricular end-diastolic filling pressure.  There was trivial AR, mild MR.  His left atrium was mildly dilated.  PA pressures were normal.    When I initially saw him on July 20, 2018, his ECG  showed sinus rhythm at 78 with ventricular bigeminy and evidence for VA conduction with retrograde P waves post PVC.  I reviewed his echo Doppler study in detail which showed normal systolic function but grade 2 diastolic dysfunction and abnormal tissue Doppler.  I reviewed his prior lipid studies which had shown an LDL cholesterol of 135 with total cholesterol 228 and suggested follow-up laboratory be obtained.  I recommended that he discontinue atenolol and initiated metoprolol 25 mg twice a day for 2  days with increase to 50 mg twice a day.  I scheduled him for a 2-week event monitor.  He was monitored from July 21, 2018 through August 03, 2018.  Average heart rate was 75 bpm.  Throughout the monitoring period despite taking metoprolol 50 twice daily he had isolated PVCs with both bigeminy and had periods where he had up to 16 PVCs and 1 minute and on another occasion 28 PVCs and 1 minute and also had episodes of trigeminal rhythm.  He did not have any ventricular couplets.  There were no episodes of nonsustained VT.  All PVCs were isolated.  His maximal heart rate was sinus tachycardia at 139 bpm which occurred while he was exercising and his slowest heart rate was sinus bradycardia at 50 bpm.  As result, we contacted him to further increase metoprolol to 75 mg twice a day.  He subsequently underwent repeat laboratory at his office.  LDL cholesterol was improved at 100 with total cholesterol 225 triglycerides 91 and HDL was excellent at 106.  Magnesium was 2.0.  He had very mild transaminase elevation with AST at 44 and ALT at 52.  Vitamin D level was low at 15 and he subsequently has initiated vitamin D replacement.  Creatinine was 1.03 with a potassium of 4.3.  I saw him for follow-up evaluation on August 30, 2018.  SInce being on the increased metoprolol dose he had noticed improvement in his ectopy and over the past 4 days prior to the evaluation seemed to have resolved.  He denies any episodes of chest pain or tightness.  He was concerned about his grade 2 diastolic dysfunction. During that evaluation, his blood pressure was 138/80.  With his grade 2 diastolic dysfunction, I recommended the addition of spironolactone and started him at 12.5 mg daily added to his medical regimen.  I also reviewed recent laboratory in his LDL cholesterol was 100 which had improved from 135.  I recommended CAD screening with coronary calcium score.  This was done on September 19, 2018 and showed a calcium score of 46  and suggested evidence of calcification in the proximal LAD.  Presently,with the Covic 19 pandemic, he was concerned about taking his losartan.  As result for the past 2 weeks he had self withheld treatment of his losartan.  His blood pressure had increased to 135-1 40 systolically.  However, he restarted taking losartan several days ago and his blood pressure has now stabilized.  He denies any chest pain.  Most of the time his rhythm is sinus without ectopy.  However, today he did notice a period of bigeminal rhythm.  He denies any chest pain PND orthopnea.   The patient does not have symptoms concerning for COVID-19 infection (fever, chills, cough, or new shortness of breath).    Past Medical History:  Diagnosis Date   Hyperlipidemia    Hypertension    Past Surgical History:  Procedure Laterality Date   APPENDECTOMY     COLONOSCOPY  2003 & 2015  Dr Matthias Hughs   LAPAROSCOPIC RETROPUBIC PROSTATECTOMY  2008   Dr Laverle Patter   UPPER GI ENDOSCOPY  2015   twice     Current Meds  Medication Sig   amLODipine (NORVASC) 10 MG tablet TAKE 1 TABLET BY MOUTH EVERY DAY   clonazePAM (KLONOPIN) 0.5 MG tablet TAKE 1 TABLET BY MOUTH TWICE A DAY   losartan (COZAAR) 100 MG tablet TAKE 1 TABLET (100 MG TOTAL) BY MOUTH DAILY.   metoprolol tartrate (LOPRESSOR) 50 MG tablet Take 1.5 tablets (75 mg total) by mouth 2 (two) times daily.   pravastatin (PRAVACHOL) 20 MG tablet TAKE 1 TABLET BY MOUTH EVERY DAY   sertraline (ZOLOFT) 50 MG tablet Take 50 mg by mouth daily.   spironolactone (ALDACTONE) 25 MG tablet Take 0.5 tablets (12.5 mg total) by mouth daily.     Allergies:   Codeine   Social History   Tobacco Use   Smoking status: Former Smoker   Smokeless tobacco: Never Used  Substance Use Topics   Alcohol use: Yes   Drug use: No     Family Hx: The patient's family history includes Hypertension in his mother; Prostate cancer (age of onset: 105) in his father; Stroke in his mother.  There is no history of Diabetes or Heart disease.  ROS:   Please see the history of present illness General: Negative; No fevers, chills, or night sweats;  HEENT: Decreased hearing with tinnitus, he has been evaluated by Dr. Lazarus Salines negative; No changes in vision, sinus congestion, difficulty swallowing Pulmonary: Negative; No cough, wheezing, shortness of breath, hemoptysis Cardiovascular: see HPI  GI: Negative; No nausea, vomiting, diarrhea, or abdominal pain GU: History prostate CA Musculoskeletal: Negative; no myalgias, joint pain, or weakness Hematologic/Oncology: Negative; no easy bruising, bleeding Endocrine: Negative; no heat/cold intolerance; no diabetes Neuro: Negative; no changes in balance, headaches Skin: Negative; No rashes or skin lesions Psychiatric: Negative; No behavioral problems, depression Sleep: Negative; No snoring, daytime sleepiness, hypersomnolence, bruxism, restless legs, hypnogognic hallucinations, no cataplexy  All other systems reviewed and are negative.   Prior CV studies:   The following studies were reviewed today:  ECHO Study Conclusions: 07/18/2018  - Left ventricle: The cavity size was normal. Systolic function was   normal. The estimated ejection fraction was in the range of 55%   to 60%. Wall motion was normal; there were no regional wall   motion abnormalities. Features are consistent with a pseudonormal   left ventricular filling pattern, with concomitant abnormal   relaxation and increased filling pressure (grade 2 diastolic   dysfunction). Doppler parameters are consistent with elevated   ventricular end-diastolic filling pressure. - Aortic valve: There was trivial regurgitation. - Mitral valve: There was mild regurgitation. - Left atrium: The atrium was mildly dilated. - Right ventricle: Systolic function was normal. - Right atrium: The atrium was normal in size. - Pulmonary arteries: Systolic pressure was within the normal    range. - Inferior vena cava: The vessel was normal in size. - Pericardium, extracardiac: There was no pericardial effusion.   2 Week Event Monitor: 07/21/2018  Dr. Arelia Sneddon was monitored on July 21, 2018 through August 03, 2018. Average heart rate was proximately 75 bpm. Throughout the monitoring period, he had frequent isolated PVCs had both bigeminy with up to 16 PVCs and 1 minute on another occasion 28 PVCs and 1 minute and also had episodes of trigeminal rhythm with his episodes of 22, 24, and 25 occurring in 1 minute. He did not have any ventricular couplets.  There were no episodes of nonsustained ventricular tachycardia. All PVCs were isolated. He did not have any significant pauses. His maximal heart rate was sinus tachycardia at 139 bpm which occurred at 5:24 PM on August 01, 2018 and sinus bradycardia at 50 bpm which occurred on December 29 at 7:44 AM.    Labs/Other Tests and Data Reviewed:    08/30/2018 ECG (independently read by me): Normal sinus rhythm at 63 bpm.  No ectopy.  PR of 182 ms, QTc interval 421 ms.  Poor anterior R wave progression.  EKG:  An ECG dated 07/20/2018 was personally reviewed today and demonstrated:  Sinus rhythm at 78 bpm with ventricular trigeminy and evidence for VA conduction with retrograde P waves.  PR interval 180 ms, QTc interval 442 ms.  Poor anterior R wave progression.  Possible left atrial enlargement.  Recent Labs: 12/31/2017: ALT 32; BUN 18; Creatinine, Ser 1.05; Hemoglobin 13.8; Platelets 260.0; Potassium 4.6; Sodium 140; TSH 0.91   Recent Lipid Panel Lab Results  Component Value Date/Time   CHOL 228 (H) 12/31/2017 08:58 AM   TRIG 64.0 12/31/2017 08:58 AM   HDL 80.60 12/31/2017 08:58 AM   CHOLHDL 3 12/31/2017 08:58 AM   LDLCALC 135 (H) 12/31/2017 08:58 AM   LDLDIRECT 161.2 12/29/2007 12:00 AM    Wt Readings from Last 3 Encounters:  11/24/18 217 lb (98.4 kg)  08/30/18 226 lb (102.5 kg)  07/20/18 228 lb (103.4 kg)      Objective:    Vital Signs:  BP 116/75    Pulse (!) 53    Ht  (1.778 m)    Wt 217 lb (98.4 kg)    BMI 31.14 kg/m    He admits to a 7 pound weight loss and has increased his exercise since the COVID-19 pandemic has initiated  He believes this afternoon he developed an episode of recurrent bigeminal rhythm,  This was scheduled for video conference.  Unfortunately the imaging did not occur leading to transition to phone encounter He admits to feeling well and significantly improved. Respirations are normal and unlabored.  There is no audible wheezing. He was recently evaluated for hearing loss and has a history of occasional tinnitus He denies any cough.  There is no chest discomfort to palpation There is no abdominal pain. There is no swelling He denies any neurologic changes. He has normal affect and cognition.  ASSESSMENT & PLAN:    1. Ventricular ectopy with PVCs, bigeminy and previous trigeminy: Symptoms have significantly improved with changing his beta-blocker to metoprolol and titration to his current dose of 75 mg twice a day.  Most of the time he is in a normal sinus rhythm with heart rates in the upper 50s to 60 range.  Today he did notice an episode of recurrent bigeminal rhythm.  I have suggested that he can take an extra metoprolol 25 mg on an as-needed basis.  I again reviewed his Holter monitor and also discussed the possibility of ultimately switching him to long-acting metoprolol succinate and continue metoprolol tartrate. 2. Essential hypertension.  He has been on multidrug regimen consisting of amlodipine 10 mg, losartan 100 mg, and when I last saw him I started him on spironolactone 12.5 mg particularly with his diastolic dysfunction.  His blood pressure has improved.  He was concerned about COVID-19 and potential issues with ACE or ARB therapy and he self held his losartan for 2 weeks.  This resulted in his blood pressure regimen.  He has been back on  losartan for  the past 5 days and his blood pressure today.  He feels that he is not very well with Spironolactone wondering about increasing to 12.5 mg twice a day.  He will monitor his blood pressure.    3. Grade 2 diastolic dysfunction: He is feeling better with addition of spironolactone; will titrate dose to 12.5 mg twice a day. 4. Hyperlipidemia: I reviewed his most recent laboratory which he had drawn from his office.  Total cholesterol was 225, HDL 106, triglycerides 91, LDL 100.  He is currently on pravastatin 20 mg.  We discussed potency of therapies.  Particularly with his calcium score 46 and proximal LAD plaque I have suggested he switch to rosuvastatin will initially started at 10 mg.  He will repeat lipid studies in 6 weeks 5. Coronary calcium score: 46 with suggestion of proximal LAD plaque.  Will treat aggressively and changed to rosuvastatin 10 mg.  Target LDL less than 70.  He will recheck laboratory in 6 to 8 weeks and if not at goal dose titration will be done. 6. Mild obesity: He has been exercising now 2 times per day and of the past several weeks has lost 7 pounds.   COVID-19 Education: The signs and symptoms of COVID-19 were discussed with the patient and how to seek care for testing (follow up with PCP or arrange E-visit).  The importance of social distancing was discussed today.  Time:   Today, I have spent 30 minutes with the patient with telehealth technology discussing the above problems.     Medication Adjustments/Labs and Tests Ordered: Current medicines are reviewed at length with the patient today.  Concerns regarding medicines are outlined above.   Tests Ordered: No orders of the defined types were placed in this encounter.   Medication Changes: No orders of the defined types were placed in this encounter.   Disposition:  Follow up 3 months  Signed, Nicki Guadalajara, MD  11/24/2018 4:10 PM    [facility]

## 2018-11-25 ENCOUNTER — Telehealth: Payer: Self-pay | Admitting: Cardiovascular Disease

## 2018-11-25 NOTE — Telephone Encounter (Signed)
Called patient but he has to have his scheduler call back to schedule appointment. 11-25-18 ST

## 2018-12-15 DIAGNOSIS — R05 Cough: Secondary | ICD-10-CM | POA: Diagnosis not present

## 2019-01-02 ENCOUNTER — Other Ambulatory Visit: Payer: Self-pay | Admitting: Internal Medicine

## 2019-01-06 ENCOUNTER — Other Ambulatory Visit: Payer: Self-pay | Admitting: Internal Medicine

## 2019-01-12 ENCOUNTER — Telehealth: Payer: Self-pay | Admitting: *Deleted

## 2019-01-12 DIAGNOSIS — Z20822 Contact with and (suspected) exposure to covid-19: Secondary | ICD-10-CM

## 2019-01-12 NOTE — Telephone Encounter (Signed)
Referred by Dr. Molli Posey for [phone] testing. Appointment made for [facility] site on 6/15 for 9:15am. Informed to wear a mask and stay in vehicle.

## 2019-01-16 ENCOUNTER — Other Ambulatory Visit: Payer: BLUE CROSS/BLUE SHIELD

## 2019-01-16 DIAGNOSIS — Z20822 Contact with and (suspected) exposure to covid-19: Secondary | ICD-10-CM

## 2019-01-16 DIAGNOSIS — R6889 Other general symptoms and signs: Secondary | ICD-10-CM | POA: Diagnosis not present

## 2019-01-18 LAB — NOVEL CORONAVIRUS, NAA: SARS-CoV-2, NAA: NOT DETECTED

## 2019-02-01 DIAGNOSIS — Z1321 Encounter for screening for nutritional disorder: Secondary | ICD-10-CM | POA: Diagnosis not present

## 2019-02-01 DIAGNOSIS — Z13228 Encounter for screening for other metabolic disorders: Secondary | ICD-10-CM | POA: Diagnosis not present

## 2019-02-01 DIAGNOSIS — Z13220 Encounter for screening for lipoid disorders: Secondary | ICD-10-CM | POA: Diagnosis not present

## 2019-02-14 ENCOUNTER — Telehealth: Payer: Self-pay

## 2019-02-14 NOTE — Telephone Encounter (Signed)
Called and spoke with Dr.Seavey, advising of virtual visit scheduled for 07/16- patient verbalized understanding.

## 2019-02-15 ENCOUNTER — Telehealth: Payer: Self-pay | Admitting: Cardiovascular Disease

## 2019-02-15 NOTE — Telephone Encounter (Signed)
I called pt to confirm his appt with Dr Tresa EndoKelly on 02-16-19.       1. Confirm consent - "In the setting of the current Covid19 crisis, you are scheduled for a (phone or video) visit with your provider on (date) at (time).  Just as we do with many in-office visits, in order for you to participate in this visit, we must obtain consent.  If you'd like, I can send this to your mychart (if signed up) or email for you to review.  Otherwise, I can obtain your verbal consent now.  All virtual visits are billed to your insurance company just like a normal visit would be.  By agreeing to a virtual visit, we'd like you to understand that the technology does not allow for your provider to perform an examination, and thus may limit your provider's ability to fully assess your condition. If your provider identifies any concerns that need to be evaluated in person, we will make arrangements to do so.  Finally, though the technology is pretty good, we cannot assure that it will always work on either your or our end, and in the setting of a video visit, we may have to convert it to a phone-only visit.  In either situation, we cannot ensure that we have a secure connection.  Are you willing to proceed?" STAFF: Did the patient verbally acknowledge consent to telehealth visit? Document YES/NO here: Yes  FULL LENGTH CONSENT FOR TELE-HEALTH VISIT   I hereby voluntarily request, consent and authorize [facility] and its employed or contracted physicians, physician assistants, nurse practitioners or other licensed health care professionals (the Practitioner), to provide me with telemedicine health care services (the Services") as deemed necessary by the treating Practitioner. I acknowledge and consent to receive the Services by the Practitioner via telemedicine. I understand that the telemedicine visit will involve communicating with the Practitioner through live audiovisual communication technology and the disclosure of  certain medical information by electronic transmission. I acknowledge that I have been given the opportunity to request an in-person assessment or other available alternative prior to the telemedicine visit and am voluntarily participating in the telemedicine visit.  I understand that I have the right to withhold or withdraw my consent to the use of telemedicine in the course of my care at any time, without affecting my right to future care or treatment, and that the Practitioner or I may terminate the telemedicine visit at any time. I understand that I have the right to inspect all information obtained and/or recorded in the course of the telemedicine visit and may receive copies of available information for a reasonable fee.  I understand that some of the potential risks of receiving the Services via telemedicine include:   Delay or interruption in medical evaluation due to technological equipment failure or disruption;  Information transmitted may not be sufficient (e.g. poor resolution of images) to allow for appropriate medical decision making by the Practitioner; and/or   In rare instances, security protocols could fail, causing a breach of personal health information.  Furthermore, I acknowledge that it is my responsibility to provide information about my medical history, conditions and care that is complete and accurate to the best of my ability. I acknowledge that Practitioner's advice, recommendations, and/or decision may be based on factors not within their control, such as incomplete or inaccurate data provided by me or distortions of diagnostic images or specimens that may result from electronic transmissions. I understand that the practice of medicine is not  an Chief Strategy Officer and that Practitioner makes no warranties or guarantees regarding treatment outcomes. I acknowledge that I will receive a copy of this consent concurrently upon execution via email to the email address I last provided but  may also request a printed copy by calling the office of [place].    I understand that my insurance will be billed for this visit.   I have read or had this consent read to me.  I understand the contents of this consent, which adequately explains the benefits and risks of the Services being provided via telemedicine.   I have been provided ample opportunity to ask questions regarding this consent and the Services and have had my questions answered to my satisfaction.  I give my informed consent for the services to be provided through the use of telemedicine in my medical care  By participating in this telemedicine visit I agree to the above.

## 2019-02-16 ENCOUNTER — Telehealth (INDEPENDENT_AMBULATORY_CARE_PROVIDER_SITE_OTHER): Payer: BC Managed Care – PPO | Admitting: Cardiovascular Disease

## 2019-02-16 VITALS — BP 124/74 | HR 58

## 2019-02-16 DIAGNOSIS — E785 Hyperlipidemia, unspecified: Secondary | ICD-10-CM

## 2019-02-16 DIAGNOSIS — I10 Essential (primary) hypertension: Secondary | ICD-10-CM

## 2019-02-16 DIAGNOSIS — I519 Heart disease, unspecified: Secondary | ICD-10-CM | POA: Diagnosis not present

## 2019-02-16 DIAGNOSIS — I493 Ventricular premature depolarization: Secondary | ICD-10-CM | POA: Diagnosis not present

## 2019-02-16 DIAGNOSIS — I5189 Other ill-defined heart diseases: Secondary | ICD-10-CM

## 2019-02-16 DIAGNOSIS — E669 Obesity, unspecified: Secondary | ICD-10-CM

## 2019-02-16 MED ORDER — ROSUVASTATIN CALCIUM 20 MG PO TABS: 20.0000 mg | ORAL_TABLET | Freq: Every day | ORAL | 2 refills | Status: DC

## 2019-02-16 NOTE — Patient Instructions (Addendum)
Medication Instructions:  Increase Rosuvastatin to 40 mg.   If you need a refill on your cardiac medications before your next appointment, please call your pharmacy.   Follow-Up: At [facility], you and your health needs are our priority.  As part of our continuing mission to provide you with exceptional heart care, we have created designated Provider Care Teams.  These Care Teams include your primary Cardiologist (physician) and Advanced Practice Providers (APPs -  Physician Assistants and Nurse Practitioners) who all work together to provide you with the care you need, when you need it. You will need a follow up appointment in 6 months (face to face).  Please call our office 2 months in advance to schedule this appointment.  You may see Shelva Majestic, MD or one of the following Advanced Practice Providers on your designated Care Team: [place], [place] . Fabian Sharp, PA-C

## 2019-02-16 NOTE — Progress Notes (Signed)
Virtual Visit via Telephone Note   This visit type was conducted due to national recommendations for restrictions regarding the COVID-19 Pandemic (e.g. social distancing) in an effort to limit this patient's exposure and mitigate transmission in our community.  Due to his co-morbid illnesses, this patient is at least at moderate risk for complications without adequate follow up.  This format is felt to be most appropriate for this patient at this time.  The patient did not have access to video technology/had technical difficulties with video requiring transitioning to audio format only (telephone).  All issues noted in this document were discussed and addressed.  No physical exam could be performed with this format.  Please refer to the patient's chart for his  consent to [facility].   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 He has a history of some anxiety for which he has taken sertraline and clonazepam. He also has mild hyperlipidemia for which she has been on pravastatin 20 mg.  In December 2019, he began to notice palpitations. His apple watch had detected PVCs. He was seen by Dr. Cheryll CockayneStacy Burns on July 11, 2018. Due to his recent ectopy, he was scheduled to undergo cardiology evaluation and was initially given an appointment to see another physician at the end of January. He had called the office and requested to see if I could see him and as a result I added him onto my  schedule today for assessment.  When I saw him for initial evaluation in December 2019 he denied any episodes of chest pain or change in exercise tolerance. He typically exercises on elliptical and tries to do some form of activity at least 5 days/week for 30 to 40 minutes if at all possible. Despite taking atenolol 25 mg he continued to experience the palpitations. He denied presyncope or syncope. He was unaware of any salvos of ectopy or sustained arrhythmia. He is stopped drinking caffeine for the last several days. Remotely he believes that he may have had an episode of atrial fibrillation which lasted for hours in 2010 but was not aware of any recurrence. He denies any difficulty with sleep. His sleep is restorative. He denies frequent nocturia. He denies daytime sleepiness.   Prior to hisinitialevaluation, he underwent an echo Doppler study on July 18, 2018 which revealed an EF of 55 to 60%. He had normal wall motion. There was grade 2 diastolic dysfunction and Doppler parameters were consistent with elevated ventricular end-diastolic filling pressure. There was trivial AR, mild MR. His left atrium was mildly dilated. PA pressures were normal.   When I initially saw him on July 20, 2018, his ECG showed sinus rhythm at 78 with ventricular bigeminy and evidence for VA conduction with retrograde P waves post PVC. I reviewed his echo Doppler study in detail which showed normal systolic function but grade 2 diastolic dysfunction and abnormal tissue Doppler. I reviewed his prior lipid studies which had shown  an LDL cholesterol of 135 with total cholesterol 228 and suggested follow-up laboratory be obtained. I recommended that he discontinue atenolol and initiated metoprolol 25 mg twice a day for 2 days with increase to 50 mg twice a day. I scheduled him for a 2-week event monitor. He was monitored from July 21, 2018 through August 03, 2018. Average heart rate was 75 bpm.  Throughout the monitoring period despite taking metoprolol 50 twice daily he had isolated PVCs with both bigeminy and had periods where he had up to 16 PVCs and 1 minute and on another occasion 28 PVCs and 1 minute and also had episodes of trigeminal rhythm. He did not have any ventricular couplets. There were no episodes of nonsustained VT. All PVCs were isolated. His maximal heart rate was sinus tachycardia at 139 bpm which occurred while he was exercising and his slowest heart rate was sinus bradycardia at 50 bpm. As result, we contacted him to further increase metoprolol to 75 mg twice a day. He subsequently underwent repeat laboratory at his office. LDL cholesterol was improved at 100 with total cholesterol 225 triglycerides 91 and HDL was excellent at 106. Magnesium was 2.0. He had very mild transaminase elevation with AST at 44 and ALT at 52. Vitamin D level was low at 15 and he subsequently has initiated vitamin D replacement. Creatinine was 1.03 with a potassium of 4.3.  I saw him for follow-up evaluation on August 30, 2018.  SInce being on the increased metoprolol dose he had noticed improvement in his ectopy and over the past 4 days prior to the evaluation seemed to have resolved. He denies any episodes of chest pain or tightness. He was concerned about his grade 2 diastolic dysfunction. During that evaluation, his blood pressure was 138/80.  With his grade 2 diastolic dysfunction, I recommended the addition of spironolactone and started him at 12.5 mg daily added to his medical regimen.  I also reviewed recent laboratory in his LDL cholesterol was 100 which had improved from 135.  I recommended CAD screening with coronary calcium score.  This was done on September 19, 2018 and showed a calcium score of 46 and suggested evidence of calcification in the proximal LAD.  I had a telemedicine evaluation with him in April 2020.  At that time with the Covic 19 pandemic, he was concerned about  taking his losartan.  As result for 2 weeks he had self withheld treatment of his losartan.  His blood pressure had increased to 135-140 systolically.  However, he restarted taking losartan several days ago and his blood pressure stabilized.  He denied any chest pain.  Most of the time his rhythm is sinus without ectopy.  However,  he did notice a period of bigeminal rhythm the morning of his visit.  He denied any PND orthopnea.  During his telemedicine evaluation I recommended he discontinue pravastatin and changed him to rosuvastatin initially at 10 mg daily.  Spironolactone 12.5 mg daily was also increased to twice a day.  On his current therapy, he states his blood pressure has been excellent.  He recently had recheck of his lipid studies which shows improvement with total cholesterol 187, LDL reduced to 85, HDL 88, and triglycerides 68.  He has continued to exercise regularly, walking on elliptical and doing some weight training.  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  He is unaware of any significant rhythm abnormality and rarely if any notes an isolated PVC.  He feels  well and presents for follow-up evaluation.   The patient does not have symptoms concerning for COVID-19 infection (fever, chills, cough, or new shortness of breath).    Past Medical History:  Diagnosis Date   Hyperlipidemia    Hypertension    Past Surgical History:  Procedure Laterality Date   APPENDECTOMY     COLONOSCOPY  2003 & 2015   Dr Cristina Gong   LAPAROSCOPIC RETROPUBIC PROSTATECTOMY  2008   Dr Alinda Money   UPPER GI ENDOSCOPY  2015   twice     Current Meds  Medication Sig   amLODipine (NORVASC) 10 MG tablet TAKE 1 TABLET BY MOUTH EVERY DAY   clonazePAM (KLONOPIN) 0.5 MG tablet TAKE 1 TABLET BY MOUTH TWICE A DAY   losartan (COZAAR) 100 MG tablet TAKE 1 TABLET BY MOUTH EVERY DAY. Need office visit for more refills.   metoprolol tartrate (LOPRESSOR) 50 MG tablet Take 1.5 tablets (75 mg  total) by mouth 2 (two) times daily.   rosuvastatin (CRESTOR) 10 MG tablet Take 1 tablet (10 mg total) by mouth daily.   sertraline (ZOLOFT) 50 MG tablet TAKE 1 TABLET BY MOUTH EVERY DAY   spironolactone (ALDACTONE) 25 MG tablet Take 0.5 tablets (12.5 mg total) by mouth 2 (two) times daily.     Allergies:   Codeine   Social History   Tobacco Use   Smoking status: Former Smoker   Smokeless tobacco: Never Used  Substance Use Topics   Alcohol use: Yes   Drug use: No     Family Hx: The patient's family history includes Hypertension in his mother; Prostate cancer (age of onset: 34) in his father; Stroke in his mother. There is no history of Diabetes or Heart disease.  ROS:   Please see the history of present illness.    No fever chills or night sweats No awareness of any recent palpitations No cough shortness of breath No cold or heat intolerance Negative GI symptoms Remote history of prostate CA No myalgias or arthralgias Sleeping well All other systems reviewed and are negative.   Prior CV studies:   The following studies were reviewed today:   ECHO Study Conclusions: 07/18/2018  - Left ventricle: The cavity size was normal. Systolic function was normal. The estimated ejection fraction was in the range of 55% to 60%. Wall motion was normal; there were no regional wall motion abnormalities. Features are consistent with a pseudonormal left ventricular filling pattern, with concomitant abnormal relaxation and increased filling pressure (grade 2 diastolic dysfunction). Doppler parameters are consistent with elevated ventricular end-diastolic filling pressure. - Aortic valve: There was trivial regurgitation. - Mitral valve: There was mild regurgitation. - Left atrium: The atrium was mildly dilated. - Right ventricle: Systolic function was normal. - Right atrium: The atrium was normal in size. - Pulmonary arteries: Systolic pressure was within the  normal range. - Inferior vena cava: The vessel was normal in size. - Pericardium, extracardiac: There was no pericardial effusion.   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 Average heart rate was proximately 75 bpm. Throughout the monitoring period, he had frequent isolated PVCs had both bigeminy with up to 16 PVCs and 1 minute on another occasion 28 PVCs and 1 minute and also had episodes of trigeminal rhythm with his episodes of 22, 24, and 25 occurring in 1 minute. He did not have any ventricular couplets. There were no episodes of nonsustained ventricular tachycardia. All PVCs were isolated.  He did not have any significant pauses. His maximal heart rate was sinus tachycardia at 139 bpm which occurred at 5:24 PM on August 01, 2018 and sinus bradycardia at 50 bpm which occurred on December 29 at 7:44 AM.    Labs/Other Tests and Data Reviewed:     EKG: 08/30/2018 ECG(independently read by me):Normal sinus rhythm at 63 bpm. No ectopy. PR of 182 ms, QTc interval 421 ms. Poor anterior R wave progression.   Recent Labs: No results found for requested labs within last 8760 hours.   Recent Lipid Panel Lab Results  Component Value Date/Time   CHOL 228 (H) 12/31/2017 08:58 AM   TRIG 64.0 12/31/2017 08:58 AM   HDL 80.60 12/31/2017 08:58 AM   CHOLHDL 3 12/31/2017 08:58 AM   LDLCALC 135 (H) 12/31/2017 08:58 AM   LDLDIRECT 161.2 12/29/2007 12:00 AM    Wt Readings from Last 3 Encounters:  11/24/18 217 lb (98.4 kg)  08/30/18 226 lb (102.5 kg)  07/20/18 228 lb (103.4 kg)     Objective:    Vital Signs:  BP 124/74    Pulse (!) 58    This was a phone visit and physical exam could not be performed.  There is normal breathing with no wheezing Pulse is stable in the upper 50s without ectopy on his palpation and apple watch No chest pain PND orthopnea No muscle tenderness to palpation No edema No change  neurologically Normal affect and mood    ASSESSMENT & PLAN:    1. History of ventricular ectopy with PVCs, bigeminy and trigeminy: Symptoms have resolved on current therapy.  Heart rate now with sinus bradycardia at 58 bpm.  He continues to be on metoprolol 75 mg twice a day and has not required any additional PRN dosing. 2. Essential hypertension: Blood pressure now well controlled on amlodipine 10 mg, losartan 100 mg, and spironolactone 12.5 mg twice a day. 3. Grade 2 diastolic dysfunction: He is feeling improved with the addition of spironolactone.  No exertional dyspnea or chest pain 4. Hyperlipidemia: Recent lipid studies are improved with LDL cholesterol now at 88 on rosuvastatin 10 mg in place of pravastatin.  With his calcium score of 46 and suggestion of plaque in the proximal LAD I have recommended further titration of rosuvastatin to 20 mg daily to reach target LDL less than 70 and hope to induce plaque aggression. 5. 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  He will recheck lipid studies and LFTs in 3 months with target LDL less than 70. 6. Mild obesity: He is exercise has increased.  He believes he has lost additional weight but has not weighed himself recently.  COVID-19 Education: The signs and symptoms of COVID-19 were discussed with the patient and how to seek care for testing (follow up with PCP or arrange E-visit).  The importance of social distancing was discussed today.  Time:   Today, I have spent 18 minutes with the patient with telehealth technology discussing the above problems.     Medication Adjustments/Labs and Tests Ordered: Current medicines are reviewed at length with the patient today.  Concerns regarding medicines are outlined above.   Tests Ordered: No orders of the defined types were placed in this encounter.   Medication Changes: No orders of the defined types were placed in this encounter.   Follow Up:  6  months Signed, Nicki Guadalajarahomas Vika Buske, MD  02/16/2019 1:48 PM    [facility]

## 2019-02-27 DIAGNOSIS — R197 Diarrhea, unspecified: Secondary | ICD-10-CM | POA: Diagnosis not present

## 2019-02-27 DIAGNOSIS — R509 Fever, unspecified: Secondary | ICD-10-CM | POA: Diagnosis not present

## 2019-02-27 DIAGNOSIS — Z20828 Contact with and (suspected) exposure to other viral communicable diseases: Secondary | ICD-10-CM | POA: Diagnosis not present

## 2019-03-02 DIAGNOSIS — R799 Abnormal finding of blood chemistry, unspecified: Secondary | ICD-10-CM | POA: Diagnosis not present

## 2019-03-03 ENCOUNTER — Other Ambulatory Visit: Payer: Self-pay | Admitting: Internal Medicine

## 2019-03-06 ENCOUNTER — Other Ambulatory Visit: Payer: Self-pay | Admitting: Internal Medicine

## 2019-03-06 DIAGNOSIS — F4329 Adjustment disorder with other symptoms: Secondary | ICD-10-CM

## 2019-03-07 NOTE — Telephone Encounter (Signed)
Check [place] registry last filled 01/31/2019.Marland KitchenJohny Chess

## 2019-03-17 ENCOUNTER — Encounter: Payer: BC Managed Care – PPO | Admitting: Internal Medicine

## 2019-03-23 NOTE — Patient Instructions (Addendum)
All other Health Maintenance issues reviewed.   All recommended immunizations and age-appropriate screenings are up-to-date or discussed.  No immunization administered today.   Medications reviewed and updated.  Changes include :   none  Your prescription(s) have been submitted to your pharmacy. Please take as directed and contact our office if you believe you are having problem(s) with the medication(s).   Please followup in 1 year    Health Maintenance, Male Adopting a healthy lifestyle and getting preventive care are important in promoting health and wellness. Ask your health care provider about:  The right schedule for you to have regular tests and exams.  Things you can do on your own to prevent diseases and keep yourself healthy. What should I know about diet, weight, and exercise? Eat a healthy diet   Eat a diet that includes plenty of vegetables, fruits, low-fat dairy products, and lean protein.  Do not eat a lot of foods that are high in solid fats, added sugars, or sodium. Maintain a healthy weight Body mass index (BMI) is a measurement that can be used to identify possible weight problems. It estimates body fat based on height and weight. Your health care provider can help determine your BMI and help you achieve or maintain a healthy weight. Get regular exercise Get regular exercise. This is one of the most important things you can do for your health. Most adults should:  Exercise for at least 150 minutes each week. The exercise should increase your heart rate and make you sweat (moderate-intensity exercise).  Do strengthening exercises at least twice a week. This is in addition to the moderate-intensity exercise.  Spend less time sitting. Even light physical activity can be beneficial. Watch cholesterol and blood lipids Have your blood tested for lipids and cholesterol at 66 years of age, then have this test every 5 years. You may need to have your cholesterol  levels checked more often if:  Your lipid or cholesterol levels are high.  You are older than 66 years of age.  You are at high risk for heart disease. What should I know about cancer screening? Many types of cancers can be detected early and may often be prevented. Depending on your health history and family history, you may need to have cancer screening at various ages. This may include screening for:  Colorectal cancer.  Prostate cancer.  Skin cancer.  Lung cancer. What should I know about heart disease, diabetes, and high blood pressure? Blood pressure and heart disease  High blood pressure causes heart disease and increases the risk of stroke. This is more likely to develop in people who have high blood pressure readings, are of African descent, or are overweight.  Talk with your health care provider about your target blood pressure readings.  Have your blood pressure checked: ? Every 3-5 years if you are 4218-66 years of age. ? Every year if you are 66 years old or older.  If you are between the ages of 6265 and 3775 and are a current or former smoker, ask your health care provider if you should have a one-time screening for abdominal aortic aneurysm (AAA). Diabetes Have regular diabetes screenings. This checks your fasting blood sugar level. Have the screening done:  Once every three years after age 66 if you are at a normal weight and have a low risk for diabetes.  More often and at a younger age if you are overweight or have a high risk for diabetes. What should I know  about preventing infection? Hepatitis B If you have a higher risk for hepatitis B, you should be screened for this virus. Talk with your health care provider to find out if you are at risk for hepatitis B infection. Hepatitis C Blood testing is recommended for:  Everyone born from [birth year] through 1963.  Anyone with known risk factors for hepatitis C. Sexually transmitted infections (STIs)  You should be  screened each year for STIs, including gonorrhea and chlamydia, if: ? You are sexually active and are younger than 66 years of age. ? You are older than 66 years of age and your health care provider tells you that you are at risk for this type of infection. ? Your sexual activity has changed since you were last screened, and you are at increased risk for chlamydia or gonorrhea. Ask your health care provider if you are at risk.  Ask your health care provider about whether you are at high risk for HIV. Your health care provider may recommend a prescription medicine to help prevent HIV infection. If you choose to take medicine to prevent HIV, you should first get tested for HIV. You should then be tested every 3 months for as long as you are taking the medicine. Follow these instructions at home: Lifestyle  Do not use any products that contain nicotine or tobacco, such as cigarettes, e-cigarettes, and chewing tobacco. If you need help quitting, ask your health care provider.  Do not use street drugs.  Do not share needles.  Ask your health care provider for help if you need support or information about quitting drugs. Alcohol use  Do not drink alcohol if your health care provider tells you not to drink.  If you drink alcohol: ? Limit how much you have to 0-2 drinks a day. ? Be aware of how much alcohol is in your drink. In the [place] one drink equals one 12 oz bottle of beer (355 mL), one 5 oz glass of wine (148 mL), or one 1 oz glass of hard liquor (44 mL). General instructions  Schedule regular health, dental, and eye exams.  Stay current with your vaccines.  Tell your health care provider if: ? You often feel depressed. ? You have ever been abused or do not feel safe at home. Summary  Adopting a healthy lifestyle and getting preventive care are important in promoting health and wellness.  Follow your health care provider's instructions about healthy diet, exercising, and getting  tested or screened for diseases.  Follow your health care provider's instructions on monitoring your cholesterol and blood pressure. This information is not intended to replace advice given to you by your health care provider. Make sure you discuss any questions you have with your health care provider. Document Released: 01/16/2008 Document Revised: 07/13/2018 Document Reviewed: 07/13/2018 Elsevier Patient Education  2020 [employer].

## 2019-03-23 NOTE — Progress Notes (Signed)
Subjective:    Patient ID: Courtney Heys, MD, male    DOB: 05-24-1951, 68 y.o.   MRN: [id number]  HPI He is here for a physical exam.   He has been following with cardiology and has had a few changes in medication.  He denies palpitations/PVCs.  He is exercising.    He has some difficulty sleeping at times.  He was on ambien in the past and would like to take that as needed.      Medications and allergies reviewed with patient and updated if appropriate.  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 Need office visit for more refills. 90 tablet 0   metoprolol tartrate (LOPRESSOR) 50 MG tablet Take 1.5 tablets (75 mg total) by mouth 2 (two) times daily. 180 tablet 3   rosuvastatin (CRESTOR) 20 MG tablet Take 1 tablet (20 mg total) by mouth daily. 90 tablet 2   sertraline (ZOLOFT) 50 MG tablet TAKE 1 TABLET BY MOUTH EVERY DAY 90 tablet 0   spironolactone (ALDACTONE) 25 MG tablet Take 0.5 tablets (12.5 mg total) by mouth 2 (two) times daily. 30 tablet 5   No current facility-administered medications on file prior to visit.     Past Medical History:  Diagnosis Date   Hyperlipidemia    Hypertension     Past Surgical History:  Procedure Laterality Date   APPENDECTOMY       COLONOSCOPY  2003 & 2015   Dr Cristina Gong   LAPAROSCOPIC RETROPUBIC PROSTATECTOMY  2008   Dr Alinda Money   UPPER GI ENDOSCOPY  2015   twice    Social History   Socioeconomic History   Marital status: Married    Spouse name: Not on file   Number of children: Not on file   Years of education: Not on file   Highest education level: Not on file  Occupational History   Not on file  Social Needs   Financial resource strain: Not on file   Food insecurity    Worry: Not on file    Inability: Not on file   Transportation needs    Medical: Not on file    Non-medical: Not on file  Tobacco Use   Smoking status: Former Smoker   Smokeless tobacco: Never Used  Substance and Sexual Activity   Alcohol use: Yes   Drug use: No   Sexual activity: Not on file  Lifestyle   Physical activity    Days per week: Not on file    Minutes per session: Not on file   Stress: Not on file  Relationships   Social connections    Talks on phone: Not on file    Gets together: Not on file    Attends religious service: Not on file  Active member of club or organization: Not on file    Attends meetings of clubs or organizations: Not on file    Relationship status: Not on file  Other Topics Concern   Not on file  Social History Narrative   Not on file    Family History  Problem Relation Age of Onset   Stroke Mother        in 10980s; died of SDH   Hypertension Mother    Prostate cancer Father [id number]   Diabetes Neg Hx    Heart disease Neg Hx     Review of Systems  Constitutional: Negative for chills and fever.  Eyes: Negative for visual disturbance.  Respiratory: Negative for cough, shortness of breath and wheezing.   Cardiovascular: Negative for chest pain, palpitations and leg swelling.  Gastrointestinal: Negative for abdominal pain, blood in stool, constipation, diarrhea and nausea.  Genitourinary: Negative for dysuria and hematuria.  Musculoskeletal: Negative for back pain.   Skin: Negative for rash.  Neurological: Negative for light-headedness and headaches.  Psychiatric/Behavioral: Positive for sleep disturbance. Negative for dysphoric mood. The patient is nervous/anxious.        Objective:   Vitals:   03/24/19 1324  BP: 132/80  Pulse: (!) 58  Resp: 16  Temp: 98.1 F (36.7 C)  SpO2: 97%   Filed Weights   03/24/19 1324  Weight: 223 lb 12.8 oz (101.5 kg)   Body mass index is 32.11 kg/m.  Wt Readings from Last 3 Encounters:  03/24/19 223 lb 12.8 oz (101.5 kg)  11/24/18 217 lb (98.4 kg)  08/30/18 226 lb (102.5 kg)     Physical Exam Constitutional: He appears well-developed and well-nourished. No distress.  HENT:  Head: Normocephalic and atraumatic.  Right Ear: External ear normal.  Left Ear: External ear normal.  Mouth/Throat: Oropharynx is clear and moist.  Normal ear canals and TM b/l  Eyes: Conjunctivae and EOM are normal.  Neck: Neck supple. No tracheal deviation present. No thyromegaly present.  No carotid bruit  Cardiovascular: Normal rate, regular rhythm, normal heart sounds and intact distal pulses.   No murmur heard. Pulmonary/Chest: Effort normal and breath sounds normal. No respiratory distress. He has no wheezes. He has no rales.  Abdominal: Soft. He exhibits no distension. There is no tenderness.  Genitourinary: deferred  Musculoskeletal: He exhibits no edema.  Lymphadenopathy:   He has no cervical adenopathy.  Skin: Skin is warm and dry. He is not diaphoretic.  Psychiatric: He has a normal mood and affect. His behavior is normal.         Assessment & Plan:   Physical exam: Screening blood work   - done by cardiology Immunizations   Pneumovax today,  vaccine today Colonoscopy   Up to date  Eye exams    Up to date  Exercise   Elliptical and weights Weight  Encouraged weight loss Skin no concerns Substance abuse   none  See Problem List for Assessment and Plan of chronic medical problems.  FU in one  year

## 2019-03-24 ENCOUNTER — Ambulatory Visit (INDEPENDENT_AMBULATORY_CARE_PROVIDER_SITE_OTHER): Payer: BC Managed Care – PPO | Admitting: Internal Medicine

## 2019-03-24 ENCOUNTER — Other Ambulatory Visit: Payer: Self-pay

## 2019-03-24 ENCOUNTER — Encounter: Payer: Self-pay | Admitting: Internal Medicine

## 2019-03-24 VITALS — BP 132/80 | HR 58 | Temp 98.1°F | Resp 16 | Ht 70.0 in | Wt 223.8 lb

## 2019-03-24 DIAGNOSIS — I10 Essential (primary) hypertension: Secondary | ICD-10-CM

## 2019-03-24 DIAGNOSIS — G4700 Insomnia, unspecified: Secondary | ICD-10-CM

## 2019-03-24 DIAGNOSIS — E782 Mixed hyperlipidemia: Secondary | ICD-10-CM

## 2019-03-24 DIAGNOSIS — Z0000 Encounter for general adult medical examination without abnormal findings: Secondary | ICD-10-CM

## 2019-03-24 DIAGNOSIS — Z23 Encounter for immunization: Secondary | ICD-10-CM

## 2019-03-24 DIAGNOSIS — I5189 Other ill-defined heart diseases: Secondary | ICD-10-CM | POA: Insufficient documentation

## 2019-03-24 MED ORDER — ZOLPIDEM TARTRATE 10 MG PO TABS: 10.0000 mg | ORAL_TABLET | Freq: Every evening | ORAL | 5 refills | Status: DC | PRN

## 2019-03-24 MED ORDER — SERTRALINE HCL 50 MG PO TABS: 50.0000 mg | ORAL_TABLET | Freq: Every day | ORAL | 3 refills | Status: DC

## 2019-03-24 NOTE — Assessment & Plan Note (Signed)
Lipids better - managed by cardio Continue crestor

## 2019-03-24 NOTE — Assessment & Plan Note (Signed)
Restart ambien prn

## 2019-03-24 NOTE — Addendum Note (Signed)
Addended by: Delice Bison E on: 03/24/2019 04:05 PM   Modules accepted: Orders

## 2019-03-24 NOTE — Assessment & Plan Note (Signed)
Controlled, stable Continue current dose of medication  

## 2019-03-24 NOTE — Assessment & Plan Note (Signed)
BP well controlled Current regimen effective and well tolerated Continue current medications at current doses  

## 2019-03-28 ENCOUNTER — Other Ambulatory Visit: Payer: Self-pay

## 2019-03-28 DIAGNOSIS — Z20822 Contact with and (suspected) exposure to covid-19: Secondary | ICD-10-CM

## 2019-03-28 DIAGNOSIS — R6889 Other general symptoms and signs: Secondary | ICD-10-CM | POA: Diagnosis not present

## 2019-03-29 LAB — NOVEL CORONAVIRUS, NAA: SARS-CoV-2, NAA: NOT DETECTED

## 2019-04-05 ENCOUNTER — Other Ambulatory Visit: Payer: Self-pay | Admitting: Internal Medicine

## 2019-06-02 DIAGNOSIS — C61 Malignant neoplasm of prostate: Secondary | ICD-10-CM | POA: Diagnosis not present

## 2019-06-07 ENCOUNTER — Telehealth: Payer: Self-pay | Admitting: Cardiovascular Disease

## 2019-06-07 DIAGNOSIS — I10 Essential (primary) hypertension: Secondary | ICD-10-CM

## 2019-06-07 DIAGNOSIS — E785 Hyperlipidemia, unspecified: Secondary | ICD-10-CM

## 2019-06-07 NOTE — Telephone Encounter (Signed)
New Message:   Does pt need lab work before his appt on -15-21?1?

## 2019-06-07 NOTE — Telephone Encounter (Signed)
Will route to MD to advise if patient needs labs before appointment.

## 2019-06-09 DIAGNOSIS — C61 Malignant neoplasm of prostate: Secondary | ICD-10-CM | POA: Diagnosis not present

## 2019-06-09 DIAGNOSIS — N3941 Urge incontinence: Secondary | ICD-10-CM | POA: Diagnosis not present

## 2019-06-14 DIAGNOSIS — C44722 Squamous cell carcinoma of skin of right lower limb, including hip: Secondary | ICD-10-CM | POA: Diagnosis not present

## 2019-06-15 NOTE — Telephone Encounter (Signed)
mychart message sent labs ordered

## 2019-06-15 NOTE — Telephone Encounter (Signed)
Okay to recommend repeat fasting laboratory prior to his January appointment

## 2019-06-16 ENCOUNTER — Telehealth: Payer: Self-pay | Admitting: Cardiovascular Disease

## 2019-06-16 NOTE — Telephone Encounter (Signed)
I called Dr. Ophelia Charter.  For me of his wife's coronary calcium score which was over 696 percentile for age.  I discussed with him potential initiation of high potency statin therapy particularly with his LDL DL in the 140 range.  We will arrange for an office visit with me and discussed coronary CTA with potential FFR analysis for further evaluation.

## 2019-06-16 NOTE — Telephone Encounter (Signed)
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 Wants Dr. Claiborne Billings to give him a call when he is available.

## 2019-06-16 NOTE — Telephone Encounter (Signed)
Returned call to patient he stated his wife had a test done today and he wanted Dr.Kelly's advice.Advised Dr.Kelly in clinic.I will send message to him.

## 2019-06-16 NOTE — Telephone Encounter (Signed)
11/13: Dr. Claiborne Billings has returned call to Dr. Radene Knee

## 2019-06-21 DIAGNOSIS — Z1159 Encounter for screening for other viral diseases: Secondary | ICD-10-CM | POA: Diagnosis not present

## 2019-06-22 DIAGNOSIS — Z1159 Encounter for screening for other viral diseases: Secondary | ICD-10-CM | POA: Diagnosis not present

## 2019-06-28 DIAGNOSIS — Z1159 Encounter for screening for other viral diseases: Secondary | ICD-10-CM | POA: Diagnosis not present

## 2019-07-04 DIAGNOSIS — Z1159 Encounter for screening for other viral diseases: Secondary | ICD-10-CM | POA: Diagnosis not present

## 2019-07-05 ENCOUNTER — Other Ambulatory Visit: Payer: Self-pay | Admitting: Internal Medicine

## 2019-07-05 DIAGNOSIS — F4329 Adjustment disorder with other symptoms: Secondary | ICD-10-CM

## 2019-07-05 NOTE — Telephone Encounter (Signed)
Last OV 03/24/19 Last RF 05/29/19

## 2019-07-24 ENCOUNTER — Ambulatory Visit: Payer: BC Managed Care – PPO | Attending: Internal Medicine

## 2019-07-24 DIAGNOSIS — Z20822 Contact with and (suspected) exposure to covid-19: Secondary | ICD-10-CM

## 2019-07-24 DIAGNOSIS — Z1159 Encounter for screening for other viral diseases: Secondary | ICD-10-CM | POA: Diagnosis not present

## 2019-07-24 DIAGNOSIS — Z20828 Contact with and (suspected) exposure to other viral communicable diseases: Secondary | ICD-10-CM | POA: Diagnosis not present

## 2019-07-25 ENCOUNTER — Other Ambulatory Visit: Payer: BC Managed Care – PPO

## 2019-07-25 LAB — NOVEL CORONAVIRUS, NAA: SARS-CoV-2, NAA: NOT DETECTED

## 2019-08-12 ENCOUNTER — Other Ambulatory Visit: Payer: Self-pay | Admitting: Cardiovascular Disease

## 2019-08-18 ENCOUNTER — Encounter: Payer: Self-pay | Admitting: Cardiovascular Disease

## 2019-08-18 ENCOUNTER — Ambulatory Visit (INDEPENDENT_AMBULATORY_CARE_PROVIDER_SITE_OTHER): Payer: BC Managed Care – PPO | Admitting: Cardiovascular Disease

## 2019-08-18 ENCOUNTER — Other Ambulatory Visit: Payer: Self-pay

## 2019-08-18 DIAGNOSIS — I519 Heart disease, unspecified: Secondary | ICD-10-CM | POA: Diagnosis not present

## 2019-08-18 DIAGNOSIS — I493 Ventricular premature depolarization: Secondary | ICD-10-CM

## 2019-08-18 DIAGNOSIS — E785 Hyperlipidemia, unspecified: Secondary | ICD-10-CM

## 2019-08-18 DIAGNOSIS — I10 Essential (primary) hypertension: Secondary | ICD-10-CM

## 2019-08-18 DIAGNOSIS — R931 Abnormal findings on diagnostic imaging of heart and coronary circulation: Secondary | ICD-10-CM | POA: Diagnosis not present

## 2019-08-18 DIAGNOSIS — I5189 Other ill-defined heart diseases: Secondary | ICD-10-CM

## 2019-08-18 DIAGNOSIS — E669 Obesity, unspecified: Secondary | ICD-10-CM

## 2019-08-18 NOTE — Progress Notes (Signed)
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 Billey Gosling for evaluation of PVCs.  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 Billey Gosling for evaluation of recent ectopy development and demonstration of frequent PVCs.  I saw him for initial evaluation on July 20, 2018.  He presents for a 6 month follow-up evaluation.  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  He has a history of some anxiety for which she has taken sertraline and clonazepam.  He also has mild hyperlipidemia for which she has been on pravastatin 20 mg.  For the past 3 weeks, he has begun to notice episodes of palpitations.  His apple watch had detected PVCs.  He was seen by Dr. Billey Gosling on July 11, 2018.  Due to his recent ectopy, he was scheduled to undergo cardiology evaluation and was initially given an appointment to see another physician at the end of January.  He had called the office and requested to see if I could see him and as a result I added him onto my schedule for assessment.  He denies any chest pain.  He denies any change in exercise tolerance recently.  He typically exercises on elliptical and tries to do some form of activity at least 5 days/week for 30 to 40 minutes if at all possible.  Despite taking atenolol 25 mg he continues to experience the palpitations.  He denies presyncope or syncope.  He is unaware of any salvos of ectopy or sustained arrhythmia.  He is stopped drinking caffeine for the last several days.  Remotely he believes he may have had an episode of atrial fibrillation which lasted for hours in 2010 but was  not aware of any recurrence.  He denies any difficulty with sleep.  His sleep is restorative.  He denies frequent nocturia.  He denies daytime sleepiness.    Prior to his initial evaluation, he underwent an echo Doppler study on July 18, 2018 which revealed an EF of 55 to 60%.  He had normal wall motion.  There was grade 2 diastolic dysfunction and Doppler parameters were consistent with elevated ventricular end-diastolic filling pressure.  There was trivial AR, mild MR.  His left atrium was mildly dilated.  PA pressures were normal.    When I initially saw him on July 20, 2018, his ECG showed sinus rhythm at 78 with ventricular bigeminy and evidence for VA conduction with retrograde P waves post PVC.  I reviewed his echo Doppler study in detail which showed normal systolic function but grade 2 diastolic dysfunction and abnormal tissue Doppler.  I reviewed his prior lipid studies which had shown an LDL cholesterol of 135 with total cholesterol 228 and suggested follow-up laboratory be obtained.  I recommended that he discontinue atenolol and initiated metoprolol 25 mg twice a day for 2 days with increase to 50 mg twice a day.  I scheduled him for a 2-week event monitor.  He was monitored from July 21, 2018 through August 03, 2018.  Average heart rate was 75 bpm.  Throughout the monitoring period despite taking  metoprolol 50 twice daily he had isolated PVCs with both bigeminy and had periods where he had up to 16 PVCs and 1 minute and on another occasion 28 PVCs and 1 minute and also had episodes of trigeminal rhythm.  He did not have any ventricular couplets.  There were no episodes of nonsustained VT.  All PVCs were isolated.  His maximal heart rate was sinus tachycardia at 139 bpm which occurred while he was exercising and his slowest heart rate was sinus bradycardia at 50 bpm.  As result, we contacted him to further increase metoprolol to 75 mg twice a day.  He subsequently underwent repeat  laboratory at his office.  LDL cholesterol was improved at 100 with total cholesterol 225 triglycerides 91 and HDL was excellent at 106.  Magnesium was 2.0.  He had very mild transaminase elevation with AST at 44 and ALT at 52.  Vitamin D level was low at 15 and he subsequently has initiated vitamin D replacement.  Creatinine was 1.03 with a potassium of 4.3.  I saw him for follow-up evaluation on August 30, 2018.SInce being on the increased metoprolol dose he hadnoticed improvement in his ectopy and over the past 4 days prior to the evaluationseemedto have resolved. He denies any episodes of chest pain or tightness. Hewasconcerned about his grade 2 diastolic dysfunction. During thatevaluation, his blood pressure was 138/80. With his grade 2 diastolic dysfunction, I recommended the addition of spironolactone and started him at 12.5 mg daily added to his medical regimen.I also reviewed recent laboratory in his LDL cholesterol was 100 which had improved from 135. I recommended CAD screening with coronary calcium score. This was done on September 19, 2018 and showed a calcium score of 46 and suggested evidence of calcification in the proximal LAD.  I had a telemedicine evaluation with him in April 2020.  At that time with the [place] pandemic, he was concerned about taking his losartan. As result for 2 weeks he had selfwithheld treatment of his losartan. His blood pressure had increased to [phone] systolically. However, he restarted taking losartan several days ago and his blood pressure stabilized. He denied any chest pain. Most of the time his rhythm is sinus without ectopy. However,  he did notice a period of bigeminal rhythm the morning of his visit. He denied any PND orthopnea. During his telemedicine evaluation I recommended he discontinue pravastatin and changed him to rosuvastatin initially at 10 mg daily.  Spironolactone 12.5 mg daily was also increased to twice a day.    He had  a follow-up telemedicine evaluation on February 16, 2019.  With his medication adjustment he states his blood pressure has been excellent.  He recently had recheck of his lipid studies which shows improvement with total cholesterol 187, LDL reduced to 85, HDL 88, and triglycerides 68.  He has continued to exercise regularly, walking on elliptical and doing some weight training.  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  He felt well and was was unaware of any significant rhythm abnormality and rarely if any notes an isolated PVC.    Since his last evaluation, Maxen has continued to feel well.  He believes the metoprolol 75 mg twice a day regimen is controlling his ectopy.  He continues to be on amlodipine 10 mg, losartan 100 mg, spironolactone 12.5 mg twice a day in addition to the metoprolol 75 mg twice daily regimen.  His blood pressure has been stable.  He is unaware of any  exercise-induced arrhythmia.  He denies any PND orthopnea.  He has not been successful with weight loss but plans to make major improvement.  He continues to be on rosuvastatin now at 40 mg for hyperlipidemia.  Past Medical History:  Diagnosis Date  . Hyperlipidemia   . Hypertension     Past Surgical History:  Procedure Laterality Date  . APPENDECTOMY    . COLONOSCOPY  2003 & 2015   Dr Cristina Gong  . LAPAROSCOPIC RETROPUBIC PROSTATECTOMY  2008   Dr Alinda Money  . UPPER GI ENDOSCOPY  2015   twice    Current Medications: Outpatient Medications Prior to Visit  Medication Sig Dispense Refill  . amLODipine (NORVASC) 10 MG tablet TAKE 1 TABLET BY MOUTH EVERY DAY 90 tablet 2  . clonazePAM (KLONOPIN) 0.5 MG tablet TAKE 1 TABLET BY MOUTH TWICE A DAY 60 tablet 2  . losartan (COZAAR) 100 MG tablet TAKE 1 TABLET BY MOUTH EVERY DAY. NEED OFFICE VISIT FOR MORE REFILLS. 90 tablet 3  . metoprolol tartrate (LOPRESSOR) 50 MG tablet TAKE 1 & 1/2 TABLETS (75 MG TOTAL) BY MOUTH 2 (TWO) TIMES DAILY. 270 tablet 2  . rosuvastatin  (CRESTOR) 40 MG tablet Take 40 mg by mouth daily.     . sertraline (ZOLOFT) 50 MG tablet Take 1 tablet (50 mg total) by mouth daily. 90 tablet 3  . spironolactone (ALDACTONE) 25 MG tablet Take 12.5 mg by mouth 2 (two) times daily.    Marland Kitchen zolpidem (AMBIEN) 10 MG tablet Take 1 tablet (10 mg total) by mouth at bedtime as needed for sleep. 30 tablet 5  . spironolactone (ALDACTONE) 25 MG tablet TAKE 1/2 TABLET BY MOUTH EVERY DAY 45 tablet 4  . rosuvastatin (CRESTOR) 20 MG tablet Take 1 tablet (20 mg total) by mouth daily. 90 tablet 2   No facility-administered medications prior to visit.     Allergies:   Patient has no active allergies.   Social History   Socioeconomic History  . Marital status: Married    Spouse name: Not on file  . Number of children: Not on file  . Years of education: Not on file  . Highest education level: Not on file  Occupational History  . Not on file  Tobacco Use  . Smoking status: Former Research scientist (life sciences)  . Smokeless tobacco: Never Used  Substance and Sexual Activity  . Alcohol use: Yes  . Drug use: No  . Sexual activity: Not on file  Other Topics Concern  . Not on file  Social History Narrative  . Not on file   Social Determinants of Health   Financial Resource Strain:   . Difficulty of Paying Living Expenses: Not on file  Food Insecurity:   . Worried About Charity fundraiser in the Last Year: Not on file  . Ran Out of Food in the Last Year: Not on file  Transportation Needs:   . Lack of Transportation (Medical): Not on file  . Lack of Transportation (Non-Medical): Not on file  Physical Activity:   . Days of Exercise per Week: Not on file  . Minutes of Exercise per Session: Not on file  Stress:   . Feeling of Stress : Not on file  Social Connections:   . Frequency of Communication with Friends and Family: Not on file  . Frequency of Social Gatherings with Friends and Family: Not on file  . Attends Religious Services: Not on file  . Active Member of  Clubs or Organizations: Not on file  .  Attends Archivist Meetings: Not on file  . Marital Status: Not on file    Socially he is married for 41 years.  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  He has 1 living daughter.  Unfortunately, his son is deceased.  He is expecting his first grandchild.  Family History:  The patient's family history includes Hypertension in his mother; Prostate cancer (age of onset: 69) in his father; Stroke in his mother.   His mother died at age 19 and had hypertension.  His father died at age 52 and had prostate CA.  He has 1 sister.  ROS General: Negative; No fevers, chills, or night sweats;  HEENT: Negative; No changes in vision or hearing, sinus congestion, difficulty swallowing Pulmonary: Negative; No cough, wheezing, shortness of breath, hemoptysis Cardiovascular: See HPI GI: Negative; No nausea, vomiting, diarrhea, or abdominal pain GU: Negative; No dysuria, hematuria, or difficulty voiding Musculoskeletal: Negative; no myalgias, joint pain, or weakness Hematologic/Oncology: Negative; no easy bruising, bleeding Endocrine: Negative; no heat/cold intolerance; no diabetes Neuro: Negative; no changes in balance, headaches Skin: Negative; No rashes or skin lesions Psychiatric: Occasional anxiety Sleep: Negative; No snoring, daytime sleepiness, hypersomnolence, bruxism, restless legs, hypnogognic hallucinations, no cataplexy Other comprehensive 14 point system review is negative.   PHYSICAL EXAM:   VS:  BP 118/76   Pulse 66   Ht '5\' 10"'$  (1.778 m)   Wt 228 lb (103.4 kg)   SpO2 98%   BMI 32.71 kg/m     Repeat blood pressure by me was 124/80  Wt Readings from Last 3 Encounters:  08/18/19 228 lb (103.4 kg)  03/24/19 223 lb 12.8 oz (101.5 kg)  11/24/18 217 lb (98.4 kg)    General: Alert, oriented, no  distress.  Skin: normal turgor, no rashes, warm and dry HEENT: Normocephalic, atraumatic. Pupils equal round and reactive to light; sclera anicteric; extraocular muscles intact;  Nose without nasal septal hypertrophy Mouth/Parynx benign; Mallinpatti scale previously documented to be 3 Neck: No JVD, no carotid bruits; normal carotid upstroke Lungs: clear to ausculatation and percussion; no wheezing or rales Chest wall: without tenderness to palpitation Heart: PMI not displaced, RRR no ectopy on auscultation, s1 s2 normal, 1/6 systolic murmur, no diastolic murmur, no rubs, gallops, thrills, or heaves Abdomen: soft, nontender; no hepatosplenomehaly, BS+; abdominal aorta nontender and not dilated by palpation. Back: no CVA tenderness Pulses 2+ Musculoskeletal: full range of motion, normal strength, no joint deformities Extremities: no clubbing cyanosis or edema, Homan's sign negative  Neurologic: grossly nonfocal; Cranial nerves grossly wnl Psychologic: Normal mood and affect   Studies/Labs Reviewed:   EKG:  EKG is  ordered today. ECG (independently read by me): Sinus rhythm at 66 bpm with isolated PVCs in a trigeminal pattern.  Left axis  deviation low voltage frontal leads QTc interval 4 4 ms, PR interval 178 ms  January 2020 ECG (independently read by me): Normal sinus rhythm at 63 bpm.  No ectopy.  PR of 182 ms, QTc interval 421 ms.  Poor anterior R wave progression.  July 20, 2018 ECG (independently read by me): Sinus rhythm at 78 bpm with ventricular trigeminy and evidence for VA conduction with retrograde P waves.  PR interval 180 ms, QTc interval 442 ms.  Poor anterior R wave progression.  Possible left atrial enlargement.  Recent Labs: BMP Latest  Ref Rng & Units 12/31/2017 10/23/2016 08/21/2013  Glucose 70 - 99 mg/dL 124(H) 108(H) 101(H)  BUN 6 - 23 mg/dL '18 19 14  '$ Creatinine 0.40 - 1.50 mg/dL 1.05 1.02 0.9  Sodium 135 - 145 mEq/L 140 139 137  Potassium 3.5 - 5.1 mEq/L 4.6 4.7  3.8  Chloride 96 - 112 mEq/L 106 105 103  CO2 19 - 32 mEq/L '26 25 24  '$ Calcium 8.4 - 10.5 mg/dL 9.6 10.0 9.5     Hepatic Function Latest Ref Rng & Units 12/31/2017 10/23/2016 08/21/2013  Total Protein 6.0 - 8.3 g/dL 8.0 7.8 8.3  Albumin 3.5 - 5.2 g/dL 4.5 4.6 4.5  AST 0 - 37 U/L 29 33 61(H)  ALT 0 - 53 U/L 32 29 88(H)  Alk Phosphatase 39 - 117 U/L 67 52 58  Total Bilirubin 0.2 - 1.2 mg/dL 0.6 1.0 1.0  Bilirubin, Direct 0.0 - 0.3 mg/dL - - 0.1    CBC Latest Ref Rng & Units 12/31/2017 10/23/2016 08/21/2013  WBC 4.0 - 10.5 K/uL 6.7 7.3 7.4  Hemoglobin 13.0 - 17.0 g/dL 13.8 14.6 14.3  Hematocrit 39.0 - 52.0 % 41.1 42.9 42.5  Platelets 150.0 - 400.0 K/uL 260.0 274.0 278.0   Lab Results  Component Value Date   MCV 94.2 12/31/2017   MCV 94.5 10/23/2016   MCV 96.1 08/21/2013   Lab Results  Component Value Date   TSH 0.91 12/31/2017   Lab Results  Component Value Date   HGBA1C 5.7 01/03/2018     BNP No results found for: BNP  ProBNP No results found for: PROBNP   Lipid Panel     Component Value Date/Time   CHOL 228 (H) 12/31/2017 0858   TRIG 64.0 12/31/2017 0858   HDL 80.60 12/31/2017 0858   CHOLHDL 3 12/31/2017 0858   VLDL 12.8 12/31/2017 0858   LDLCALC 135 (H) 12/31/2017 0858   LDLDIRECT 161.2 12/29/2007 0000     RADIOLOGY: No results found.   Additional studies/ records that were reviewed today include:  I again have reviewed his prior echo Doppler study.  I extensively reviewed his 2-week event monitor as well as laboratory which he had obtained at his office.   07/18/2018 ECHO Study Conclusions  - Left ventricle: The cavity size was normal. Systolic function was   normal. The estimated ejection fraction was in the range of 55%   to 60%. Wall motion was normal; there were no regional wall   motion abnormalities. Features are consistent with a pseudonormal   left ventricular filling pattern, with concomitant abnormal   relaxation and increased filling  pressure (grade 2 diastolic   dysfunction). Doppler parameters are consistent with elevated   ventricular end-diastolic filling pressure. - Aortic valve: There was trivial regurgitation. - Mitral valve: There was mild regurgitation. - Left atrium: The atrium was mildly dilated. - Right ventricle: Systolic function was normal. - Right atrium: The atrium was normal in size. - Pulmonary arteries: Systolic pressure was within the normal   range. - Inferior vena cava: The vessel was normal in size. - Pericardium, extracardiac: There was no pericardial effusion.  2 Week Event Monitor:07/21/2018  Dr. Radene Knee was monitored on July 21, 2018 through August 03, 2018. Average heart rate was proximately 75 bpm. Throughout the monitoring period, he had frequent isolated PVCs had both bigeminy with up to 16 PVCs and 1 minute on another occasion 28 PVCs and 1 minute and also had episodes of trigeminal rhythm with his episodes of 22, 24,  and 25 occurring in 1 minute. He did not have any ventricular couplets. There were no episodes of nonsustained ventricular tachycardia. All PVCs were isolated. He did not have any significant pauses. His maximal heart rate was sinus tachycardia at 139 bpm which occurred at 5:24 PM on August 01, 2018 and sinus bradycardia at 50 bpm which occurred on December 29 at 7:44 AM.   ASSESSMENT:    1. Essential hypertension   2. Agatston coronary artery calcium score less than 100 with mild calcification in the proximal LAD   3. Hyperlipidemia with target LDL less than 70   4. Grade II diastolic dysfunction   5. Ventricular ectopy with PVC, bigeminy and trigeminy; improved with titration of metoprolol   6. 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  I initially saw  him in December after he had experienced 3 weeks of  frequent ectopy which has been documented to be frequent PVCs.  He has been documented to have a bigeminal as well as trigeminal pattern intermittently.  He has remained fairly active.  His palpitations did not improve when he held his atenolol for several days and therefore resumed atenolol without significant benefit.  His ECG at that time showed sinus rhythm with frequent ventricular bigeminy with VA conduction with retrograde P waves post PVC.  I had extensively reviewed his echo Doppler study which showed normal systolic function with grade 2 diastolic dysfunction and abnormal tissue Doppler suggesting increased LA filling pressure.  His 2-week event monitor continues to show frequent ventricular ectopy with bigeminy and trigeminy despite being changed to metoprolol 50 mg twice a day with discontinuance of atenolol.  However, since his dose was further titrated to 75 mg twice a day his ectopy is significantly improved and essentially resolved.  He admits to a longstanding hypertensive history and in the past also had a venous cerebral bleed he believes may have been related to significant hypertension.  His blood pressure today is well controlled on his regimen consisting of amlodipine 10 mg, losartan 100 mg, spironolactone 12.5 mg twice a day in addition to metoprolol tartrate 75 mg twice a day.  He is unaware of any ectopy.  On auscultation today he did not have any ectopy.  However on his ECG he was noted to have isolated PVCs in a trigeminal pattern.  His echo Doppler study has demonstrated grade 2 diastolic dysfunction and he has felt improved with the addition of spironolactone.  He had undergone calcium scoring in February 2020 which showed a score of 46 and suggested evidence of calcification in the proximal LAD.  He is asymptomatic he denies any anginal symptoms or any change in exercise tolerance.  There is no exertional dyspnea.  Based on evidence  for subclinical atherosclerosis I have stressed aggressive lipid-lowering therapy.  He is now on rosuvastatin 40 mg with target LDL less than 70.  We discussed repeating lipid studies to make certain he is at target on his increased rosuvastatin regimen.  We discussed weight loss with a BMI index of 32.7 consistent with mild obesity.  He has my phone number if issues arise.  Otherwise as long as he remains stable I will see him in 1 year for reevaluation   Medication Adjustments/Labs and Tests Ordered: Current medicines  are reviewed at length with the patient today.  Concerns regarding medicines are outlined above.  Medication changes, Labs and Tests ordered today are listed in the Patient Instructions below. Patient Instructions  Medication Instructions:  Continue current medications  *If you need a refill on your cardiac medications before your next appointment, please call your pharmacy*  Lab Work: None Ordered  Testing/Procedures: None Ordered  Follow-Up: At [facility], you and your health needs are our priority.  As part of our continuing mission to provide you with exceptional heart care, we have created designated Provider Care Teams.  These Care Teams include your primary Cardiologist (physician) and Advanced Practice Providers (APPs -  Physician Assistants and Nurse Practitioners) who all work together to provide you with the care you need, when you need it.  Your next appointment:   1 year(s)  The format for your next appointment:   In Person  Provider:   Shelva Majestic, MD        Signed, Shelva Majestic, MD  08/20/2019 10:53 AM    [facility] [address], [place], [place], [place]  [address] Phone: [phone]

## 2019-08-18 NOTE — Patient Instructions (Signed)
Medication Instructions:  Continue current medications  *If you need a refill on your cardiac medications before your next appointment, please call your pharmacy*  Lab Work: None Ordered  Testing/Procedures: None Ordered  Follow-Up: At [facility], you and your health needs are our priority.  As part of our continuing mission to provide you with exceptional heart care, we have created designated Provider Care Teams.  These Care Teams include your primary Cardiologist (physician) and Advanced Practice Providers (APPs -  Physician Assistants and Nurse Practitioners) who all work together to provide you with the care you need, when you need it.  Your next appointment:   1 year(s)  The format for your next appointment:   In Person  Provider:   Brynna Dobos Kelly, MD    

## 2019-08-20 ENCOUNTER — Encounter: Payer: Self-pay | Admitting: Cardiovascular Disease

## 2019-08-24 NOTE — Addendum Note (Signed)
Addended by: Kandice Robinsons T on: 08/24/2019 10:01 AM   Modules accepted: Orders

## 2019-09-06 DIAGNOSIS — Z13220 Encounter for screening for lipoid disorders: Secondary | ICD-10-CM | POA: Diagnosis not present

## 2019-09-06 DIAGNOSIS — Z13228 Encounter for screening for other metabolic disorders: Secondary | ICD-10-CM | POA: Diagnosis not present

## 2019-09-07 DIAGNOSIS — Z1159 Encounter for screening for other viral diseases: Secondary | ICD-10-CM | POA: Diagnosis not present

## 2019-10-10 ENCOUNTER — Other Ambulatory Visit: Payer: Self-pay | Admitting: Cardiovascular Disease

## 2019-10-17 ENCOUNTER — Other Ambulatory Visit: Payer: Self-pay | Admitting: Internal Medicine

## 2019-10-17 NOTE — Telephone Encounter (Signed)
Last OV 03/24/19 Last RF 09/12/19

## 2019-11-01 ENCOUNTER — Other Ambulatory Visit: Payer: Self-pay | Admitting: Internal Medicine

## 2019-11-01 DIAGNOSIS — F4329 Adjustment disorder with other symptoms: Secondary | ICD-10-CM

## 2019-11-01 NOTE — Telephone Encounter (Signed)
Last RF 09/12/19 Last OV 03/24/19 Next OV 04/02/20

## 2019-11-07 ENCOUNTER — Other Ambulatory Visit: Payer: Self-pay | Admitting: Internal Medicine

## 2019-11-07 ENCOUNTER — Other Ambulatory Visit: Payer: Self-pay | Admitting: Cardiovascular Disease

## 2019-11-19 ENCOUNTER — Other Ambulatory Visit: Payer: Self-pay | Admitting: Cardiovascular Disease

## 2019-11-23 DIAGNOSIS — L570 Actinic keratosis: Secondary | ICD-10-CM | POA: Diagnosis not present

## 2019-11-23 DIAGNOSIS — L850 Acquired ichthyosis: Secondary | ICD-10-CM | POA: Diagnosis not present

## 2019-11-23 DIAGNOSIS — D225 Melanocytic nevi of trunk: Secondary | ICD-10-CM | POA: Diagnosis not present

## 2020-01-11 DIAGNOSIS — H40053 Ocular hypertension, bilateral: Secondary | ICD-10-CM | POA: Diagnosis not present

## 2020-02-03 ENCOUNTER — Other Ambulatory Visit: Payer: Self-pay | Admitting: Internal Medicine

## 2020-04-01 NOTE — Patient Instructions (Addendum)
   All other Health Maintenance issues reviewed.   All recommended immunizations and age-appropriate screenings are up-to-date or discussed.  No immunization administered today.   Medications reviewed and updated.  Changes include :  none      Please followup in 1 year    Health Maintenance, Male Adopting a healthy lifestyle and getting preventive care are important in promoting health and wellness. Ask your health care provider about:  The right schedule for you to have regular tests and exams.  Things you can do on your own to prevent diseases and keep yourself healthy. What should I know about diet, weight, and exercise? Eat a healthy diet   Eat a diet that includes plenty of vegetables, fruits, low-fat dairy products, and lean protein.  Do not eat a lot of foods that are high in solid fats, added sugars, or sodium. Maintain a healthy weight Body mass index (BMI) is a measurement that can be used to identify possible weight problems. It estimates body fat based on height and weight. Your health care provider can help determine your BMI and help you achieve or maintain a healthy weight. Get regular exercise Get regular exercise. This is one of the most important things you can do for your health. Most adults should:  Exercise for at least 150 minutes each week. The exercise should increase your heart rate and make you sweat (moderate-intensity exercise).  Do strengthening exercises at least twice a week. This is in addition to the moderate-intensity exercise.  Spend less time sitting. Even light physical activity can be beneficial. Watch cholesterol and blood lipids Have your blood tested for lipids and cholesterol at 67 years of age, then have this test every 5 years. You may need to have your cholesterol levels checked more often if:  Your lipid or cholesterol levels are high.  You are older than 67 years of age.  You are at high risk for heart disease. What  should I know about cancer screening? Many types of cancers can be detected early and may often be prevented. Depending on your health history and family history, you may need to have cancer screening at various ages. This may include screening for:  Colorectal cancer.  Prostate cancer.  Skin cancer.  Lung cancer. What should I know about heart disease, diabetes, and high blood pressure? Blood pressure and heart disease  High blood pressure causes heart disease and increases the risk of stroke. This is more likely to develop in people who have high blood pressure readings, are of African descent, or are overweight.  Talk with your health care provider about your target blood pressure readings.  Have your blood pressure checked: ? Every 3-5 years if you are 18-39 years of age. ? Every year if you are 40 years old or older.  If you are between the ages of 65 and 75 and are a current or former smoker, ask your health care provider if you should have a one-time screening for abdominal aortic aneurysm (AAA). Diabetes Have regular diabetes screenings. This checks your fasting blood sugar level. Have the screening done:  Once every three years after age 45 if you are at a normal weight and have a low risk for diabetes.  More often and at a younger age if you are overweight or have a high risk for diabetes. What should I know about preventing infection? Hepatitis B If you have a higher risk for hepatitis B, you should be screened for this virus. Talk   provider to find out if you are at risk for hepatitis B infection. Hepatitis C Blood testing is recommended for:  Everyone born from [birth year] through 1963.  Anyone with known risk factors for hepatitis C. Sexually transmitted infections (STIs)  You should be screened each year for STIs, including gonorrhea and chlamydia, if: ? You are sexually active and are younger than 67 years of age. ? You are older than 67 years of age and  your health care provider tells you that you are at risk for this type of infection. ? Your sexual activity has changed since you were last screened, and you are at increased risk for chlamydia or gonorrhea. Ask your health care provider if you are at risk.  Ask your health care provider about whether you are at high risk for HIV. Your health care provider may recommend a prescription medicine to help prevent HIV infection. If you choose to take medicine to prevent HIV, you should first get tested for HIV. You should then be tested every 3 months for as long as you are taking the medicine. Follow these instructions at home: Lifestyle  Do not use any products that contain nicotine or tobacco, such as cigarettes, e-cigarettes, and chewing tobacco. If you need help quitting, ask your health care provider.  Do not use street drugs.  Do not share needles.  Ask your health care provider for help if you need support or information about quitting drugs. Alcohol use  Do not drink alcohol if your health care provider tells you not to drink.  If you drink alcohol: ? Limit how much you have to 0-2 drinks a day. ? Be aware of how much alcohol is in your drink. In the [place] one drink equals one 12 oz bottle of beer (355 mL), one 5 oz glass of wine (148 mL), or one 1 oz glass of hard liquor (44 mL). General instructions  Schedule regular health, dental, and eye exams.  Stay current with your vaccines.  Tell your health care provider if: ? You often feel depressed. ? You have ever been abused or do not feel safe at home. Summary  Adopting a healthy lifestyle and getting preventive care are important in promoting health and wellness.  Follow your health care provider's instructions about healthy diet, exercising, and getting tested or screened for diseases.  Follow your health care provider's instructions on monitoring your cholesterol and blood pressure. This information is not intended to  replace advice given to you by your health care provider. Make sure you discuss any questions you have with your health care provider. Document Revised: 07/13/2018 Document Reviewed: 07/13/2018 Elsevier Patient Education  2020 [employer].

## 2020-04-01 NOTE — Progress Notes (Signed)
Subjective:    Patient ID: Juan Balding, MD, male    DOB: 1950/08/05, 70 y.o.   MRN: [id number]  HPI He is here for a physical exam.   Overall doing fairly well.  No longer taking clonazepam.  Issues with sleep on occasions.  Other medications working well.  Medications and allergies reviewed with patient and updated if appropriate.  Patient Active Problem List   Diagnosis Date Noted  . Diastolic dysfunction 03/24/2019  . PVC (premature ventricular contraction) 07/11/2018  . Vitamin D deficiency 12/31/2017  . Anxiety 10/22/2015  . Insomnia 10/22/2015  . Barrett's esophagus 10/17/2014  . Intracranial bleed (HCC) 05/31/2012  . PROSTATE CANCER, HX OF 06/26/2009  . Cardiac dysrhythmia 12/19/2008  . Hyperlipidemia 12/29/2007  . 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 Essential hypertension 12/29/2007    Current Outpatient Medications on File Prior to Visit  Medication Sig Dispense Refill  . amLODipine (NORVASC) 10 MG tablet TAKE 1 TABLET BY MOUTH EVERY DAY 90 tablet 1  . losartan (COZAAR) 100 MG tablet TAKE 1 TABLET BY MOUTH EVERY DAY. NEED OFFICE VISIT FOR MORE REFILLS. 90 tablet 3  . metoprolol tartrate (LOPRESSOR) 50 MG tablet TAKE 1 & 1/2 TABLETS (75 MG TOTAL) BY MOUTH 2 (TWO) TIMES DAILY. 270 tablet 2  . rosuvastatin (CRESTOR) 40 MG tablet Take 1 tablet (40 mg total) by mouth daily. 90 tablet 2  . sertraline (ZOLOFT) 50 MG tablet TAKE 1 TABLET BY MOUTH EVERY DAY 90 tablet 3  . spironolactone (ALDACTONE) 25 MG tablet TAKE 0.5 TABLETS (12.5 MG TOTAL) BY MOUTH 2 (TWO) TIMES DAILY. 90 tablet 1  . zolpidem (AMBIEN) 10 MG tablet TAKE 1 TABLET (10 MG TOTAL) BY MOUTH AT BEDTIME AS NEEDED FOR SLEEP. 30 tablet 5   No current facility-administered medications on file prior to visit.    Past Medical History:  Diagnosis Date  . Hyperlipidemia   . Hypertension     Past Surgical History:  Procedure Laterality Date  . APPENDECTOMY    . COLONOSCOPY  2003 & 2015   Dr Matthias Hughs  .  LAPAROSCOPIC RETROPUBIC PROSTATECTOMY  2008   Dr Laverle Patter  . UPPER GI ENDOSCOPY  2015   twice    Social History   Socioeconomic History  . Marital status: Married    Spouse name: Not on file  . Number of children: Not on file  . Years of education: Not on file  . Highest education level: Not on file  Occupational History  . Not on file  Tobacco Use  . Smoking status: Former Games developer  . Smokeless tobacco: Never Used  Vaping Use  . Vaping Use: Never used  Substance and Sexual Activity  . Alcohol use: Yes  . Drug use: No  . Sexual activity: Not on file  Other Topics Concern  . Not on file  Social History Narrative  . Not on file   Social Determinants of Health   Financial Resource Strain:   . Difficulty of Paying Living Expenses: Not on file  Food Insecurity:   . Worried About Programme researcher, broadcasting/film/video in the Last Year: Not on file  . Ran Out of Food in the Last Year: Not on file  Transportation Needs:   . Lack of Transportation (Medical): Not on file  . Lack of Transportation (Non-Medical): Not on file  Physical Activity:   . Days of Exercise per Week: Not on file  . Minutes of Exercise per Session: Not on file  Stress:   .  Feeling of Stress : Not on file  Social Connections:   . Frequency of Communication with Friends and Family: Not on file  . Frequency of Social Gatherings with Friends and Family: Not on file  . Attends Religious Services: Not on file  . Active Member of Clubs or Organizations: Not on file  . Attends Banker Meetings: Not on file  . Marital Status: Not on file    Family History  Problem Relation Age of Onset  . Stroke Mother        in 29s; died of SDH  . Hypertension Mother   . Prostate cancer Father 104  . Diabetes Neg Hx   . Heart disease Neg Hx     Review of Systems  Constitutional: Negative for chills and fever.  Eyes: Negative for visual disturbance.  Respiratory: Negative for cough, shortness of breath and wheezing.     Cardiovascular: Negative for chest pain, palpitations and leg swelling.  Gastrointestinal: Negative for abdominal pain, blood in stool, constipation, diarrhea and nausea.  Genitourinary: Negative for difficulty urinating, dysuria and hematuria.  Musculoskeletal: Positive for back pain (occ).  Skin: Negative for rash.  Neurological: Negative for light-headedness and headaches.  Psychiatric/Behavioral: Positive for sleep disturbance. Negative for dysphoric mood. The patient is nervous/anxious.        Objective:   Vitals:   04/02/20 1544  BP: 112/68  Pulse: 63  Temp: 97.9 F (36.6 C)  SpO2: 96%   Filed Weights   04/02/20 1544  Weight: 227 lb (103 kg)   Body mass index is 32.57 kg/m.  BP Readings from Last 3 Encounters:  04/02/20 112/68  08/18/19 118/76  03/24/19 132/80    Wt Readings from Last 3 Encounters:  04/02/20 227 lb (103 kg)  08/18/19 228 lb (103.4 kg)  03/24/19 223 lb 12.8 oz (101.5 kg)     Physical Exam Constitutional: He appears well-developed and well-nourished. No distress.  HENT:  Head: Normocephalic and atraumatic.  Right Ear: External ear normal.  Left Ear: External ear normal.  Mouth/Throat: Oropharynx is clear and moist.  Normal ear canals and TM b/l  Eyes: Conjunctivae and EOM are normal.  Neck: Neck supple. No tracheal deviation present. No thyromegaly present.  No carotid bruit  Cardiovascular: Normal rate, regular rhythm, normal heart sounds and intact distal pulses.   No murmur heard. Pulmonary/Chest: Effort normal and breath sounds normal. No respiratory distress. He has no wheezes. He has no rales.  Abdominal: Soft. He exhibits no distension. There is no tenderness.  Genitourinary: deferred  Musculoskeletal: He exhibits no edema.  Lymphadenopathy:   He has no cervical adenopathy.  Skin: Skin is warm and dry. He is not diaphoretic.  Psychiatric: He has a normal mood and affect. His behavior is normal.         Assessment & Plan:    Physical exam: Screening blood work done recently someplace else-all within normals per patient.  No need to repeat Immunizations  Had covid, will get flu at work, discussed shingrix Colonoscopy   Up to date  Eye exams   Up to date  Exercise   Regular Weight weight is stable Substance abuse   none  See Problem List for Assessment and Plan of chronic medical problems.   This visit occurred during the SARS-CoV-2 public health emergency.  Safety protocols were in place, including screening questions prior to the visit, additional usage of staff PPE, and extensive cleaning of exam room while observing appropriate contact time as indicated  for disinfecting solutions.

## 2020-04-02 ENCOUNTER — Other Ambulatory Visit: Payer: Self-pay

## 2020-04-02 ENCOUNTER — Ambulatory Visit (INDEPENDENT_AMBULATORY_CARE_PROVIDER_SITE_OTHER): Payer: BC Managed Care – PPO | Admitting: Internal Medicine

## 2020-04-02 ENCOUNTER — Encounter: Payer: Self-pay | Admitting: Internal Medicine

## 2020-04-02 VITALS — BP 112/68 | HR 63 | Temp 97.9°F | Ht 70.0 in | Wt 227.0 lb

## 2020-04-02 DIAGNOSIS — I499 Cardiac arrhythmia, unspecified: Secondary | ICD-10-CM

## 2020-04-02 DIAGNOSIS — R7303 Prediabetes: Secondary | ICD-10-CM

## 2020-04-02 DIAGNOSIS — Z0000 Encounter for general adult medical examination without abnormal findings: Secondary | ICD-10-CM | POA: Diagnosis not present

## 2020-04-02 DIAGNOSIS — G4709 Other insomnia: Secondary | ICD-10-CM

## 2020-04-02 DIAGNOSIS — E782 Mixed hyperlipidemia: Secondary | ICD-10-CM

## 2020-04-02 DIAGNOSIS — I10 Essential (primary) hypertension: Secondary | ICD-10-CM

## 2020-04-02 DIAGNOSIS — F419 Anxiety disorder, unspecified: Secondary | ICD-10-CM

## 2020-04-02 MED ORDER — ZOLPIDEM TARTRATE 10 MG PO TABS: 10.0000 mg | ORAL_TABLET | Freq: Every evening | ORAL | 5 refills | Status: DC | PRN

## 2020-04-02 NOTE — Assessment & Plan Note (Signed)
Chronic Continue daily statin Regular exercise and healthy diet encouraged  

## 2020-04-02 NOTE — Assessment & Plan Note (Signed)
Chronic Intermittent We will continue Ambien as needed

## 2020-04-02 NOTE — Assessment & Plan Note (Signed)
Chronic Well controlled, stable Continue sertraline

## 2020-04-02 NOTE — Assessment & Plan Note (Signed)
Has had one episode of A. fib, was also experiencing bigeminy With current medications has remained in sinus rhythm Continue current medications

## 2020-04-02 NOTE — Assessment & Plan Note (Signed)
Chronic BP well controlled Current regimen effective and well tolerated Continue current medications at current doses  

## 2020-05-22 ENCOUNTER — Other Ambulatory Visit: Payer: Self-pay | Admitting: Internal Medicine

## 2020-05-25 ENCOUNTER — Other Ambulatory Visit: Payer: Self-pay | Admitting: Internal Medicine

## 2020-05-25 ENCOUNTER — Other Ambulatory Visit: Payer: Self-pay | Admitting: Cardiovascular Disease

## 2020-06-14 DIAGNOSIS — Z23 Encounter for immunization: Secondary | ICD-10-CM | POA: Diagnosis not present

## 2020-07-10 ENCOUNTER — Other Ambulatory Visit: Payer: Self-pay | Admitting: Cardiovascular Disease

## 2020-08-20 ENCOUNTER — Telehealth: Payer: Self-pay | Admitting: Internal Medicine

## 2020-08-20 MED ORDER — ZOLPIDEM TARTRATE 10 MG PO TABS: 10.0000 mg | ORAL_TABLET | Freq: Every evening | ORAL | 5 refills | Status: DC | PRN

## 2020-08-20 NOTE — Telephone Encounter (Signed)
1.Medication Requested:zolpidem (AMBIEN) 10 MG tablet  2. Pharmacy (Name, Street, [place]):  [facility] [phone] - [place], [place] - [address] Phone:  [phone]  Fax:  [phone]       3. On Med List:  Y  4. Last Visit with PCP:   5. Next visit date with PCP:   Agent: Please be advised that RX refills may take up to 3 business days. We ask that you follow-up with your pharmacy.

## 2020-08-23 ENCOUNTER — Other Ambulatory Visit: Payer: Self-pay | Admitting: Cardiovascular Disease

## 2020-09-19 IMAGING — CT CT HEART SCORING
2 series · 16 of 20 positions shown, 18 images · non-contrast
Comparison: None.

Addendum:
EXAM:
OVER-READ INTERPRETATION  CT CHEST

The following report is an over-read performed by radiologist Amanuel.
Geysi J Ch [REDACTED] on 09/15/2018. This
over-read does not include interpretation of cardiac or coronary
anatomy or pathology. The coronary calcium score interpretation by
the cardiologist is attached.
CLINICAL DATA: Risk stratification
Coronary Calcium Score
TECHNIQUE: The patient was scanned on a Siemens Somatom 64 slice scanner. Axial
non-contrast 3 mm slices were carried out through the heart. The
data set was analyzed on a dedicated work station and scored using
the Agatson method.

[Series 3: casc 3.0 i36f 2 bestdiast 74 % · axial · 0.43mm/px · z∈[-297,-168]mm · 8 of 57 slices shown, 10 images]
[im 7/57  vessel]
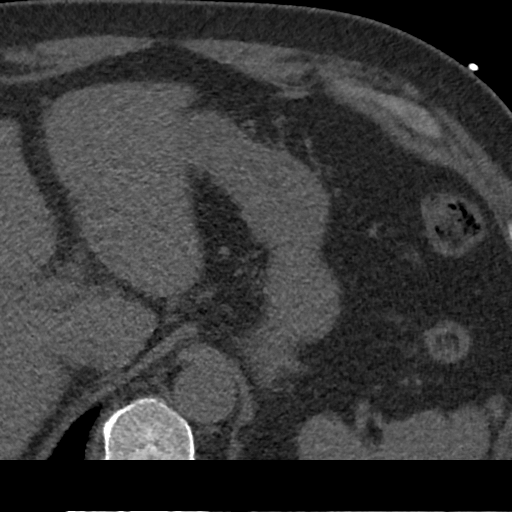
[im 7/57  lung]
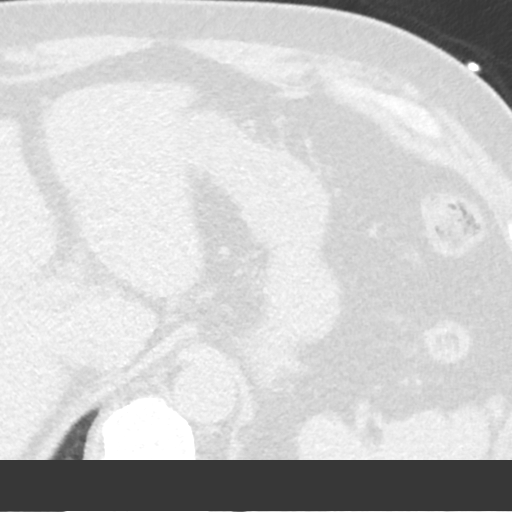
[im 13/57  vessel]
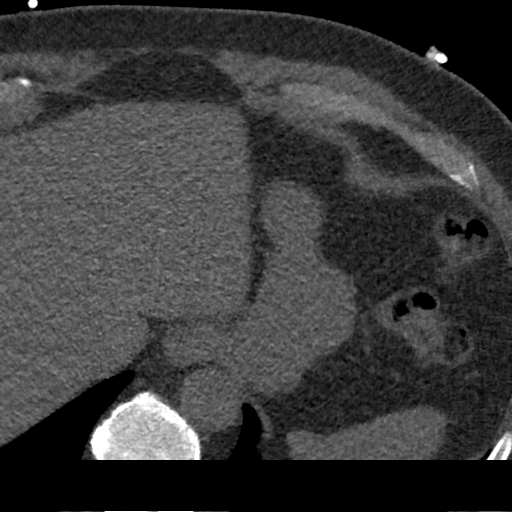
[im 19/57  vessel]
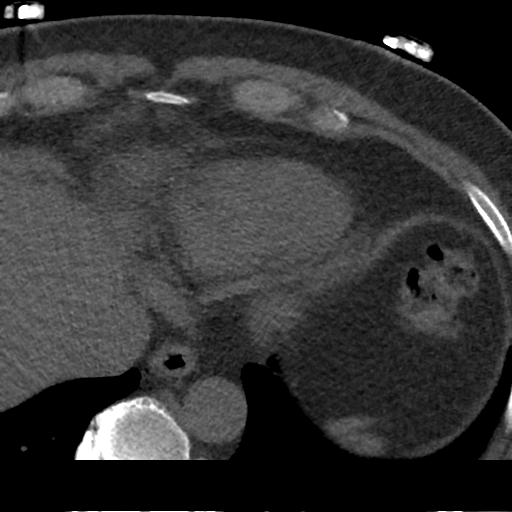
[im 25/57  vessel]
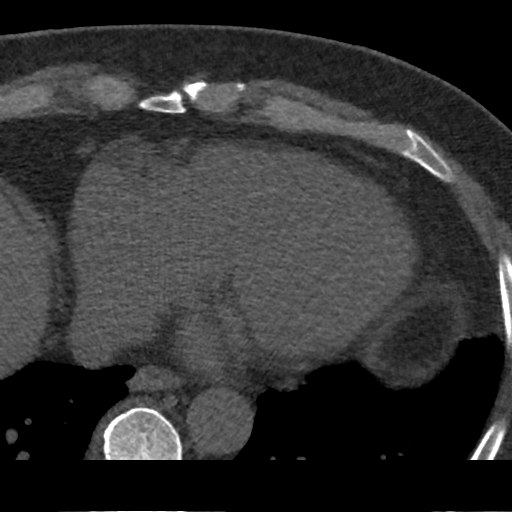
[im 32/57  vessel]
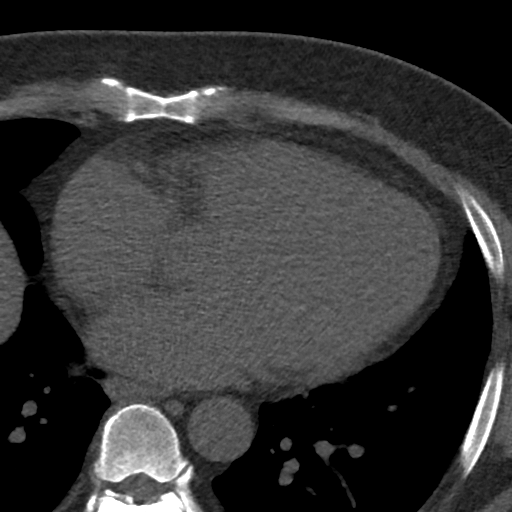
[im 32/57  lung]
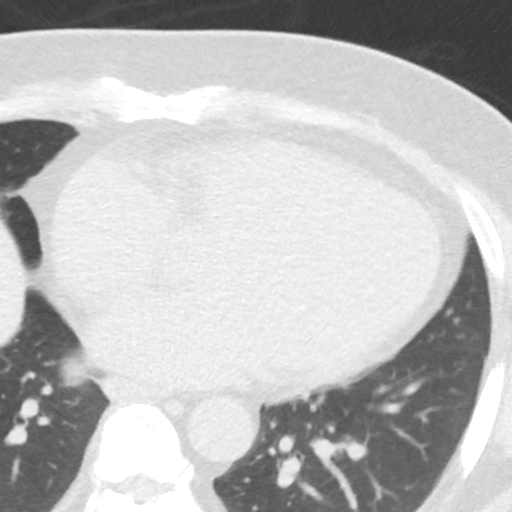
[im 38/57  vessel]
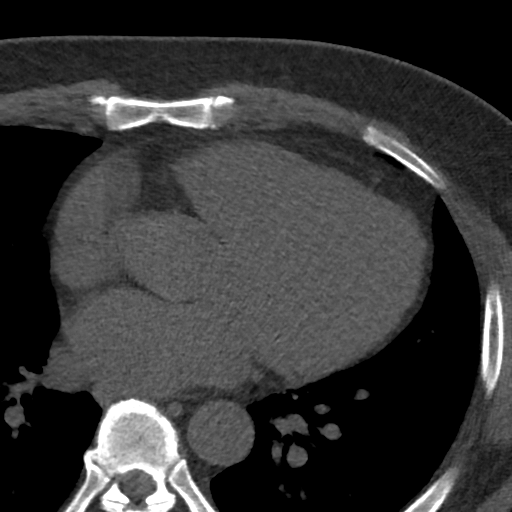
[im 44/57  vessel]
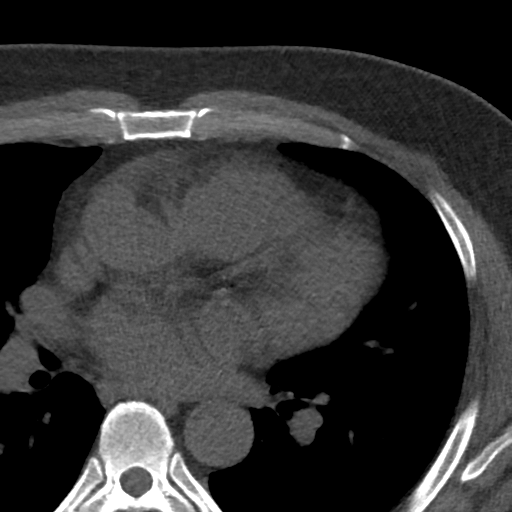
[im 50/57  vessel]
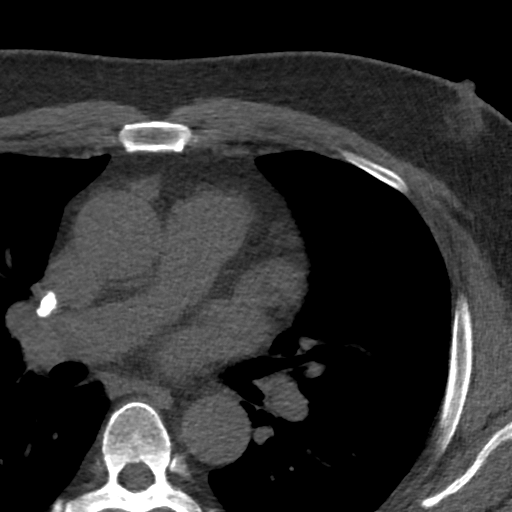

[Series 5: lung st 74 % · axial · 0.68mm/px · z∈[-298,-168]mm · 8 of 57 slices shown]
[im 7/57  lung]
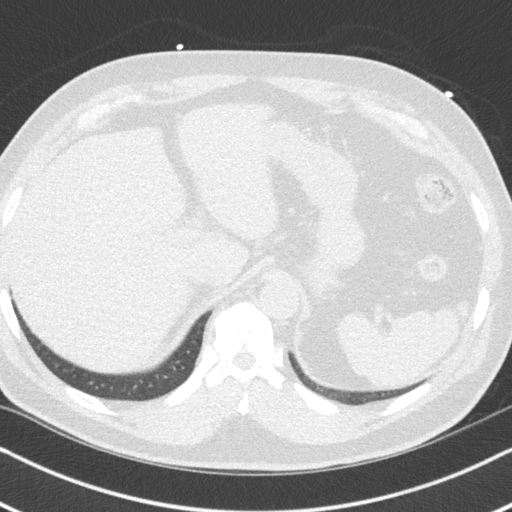
[im 13/57  lung]
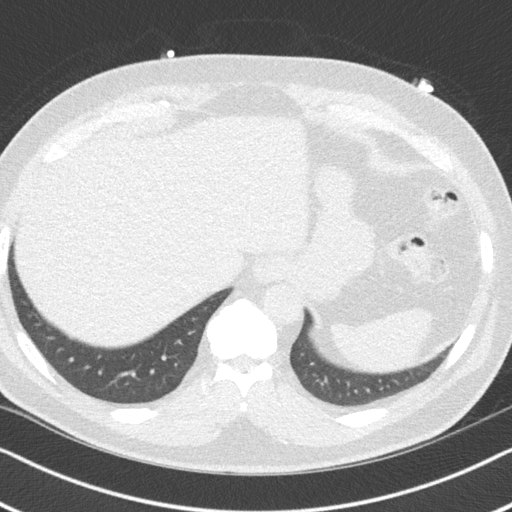
[im 19/57  lung]
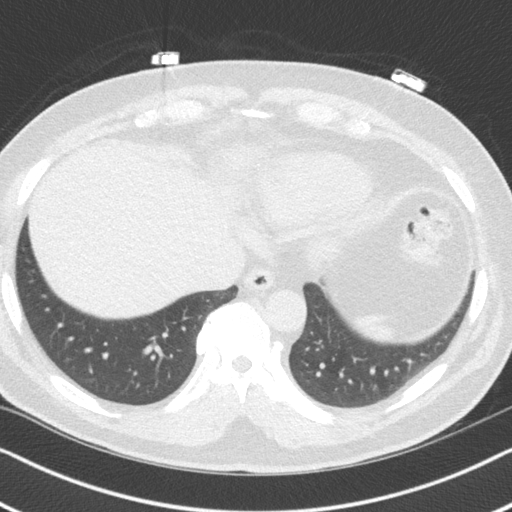
[im 25/57  lung]
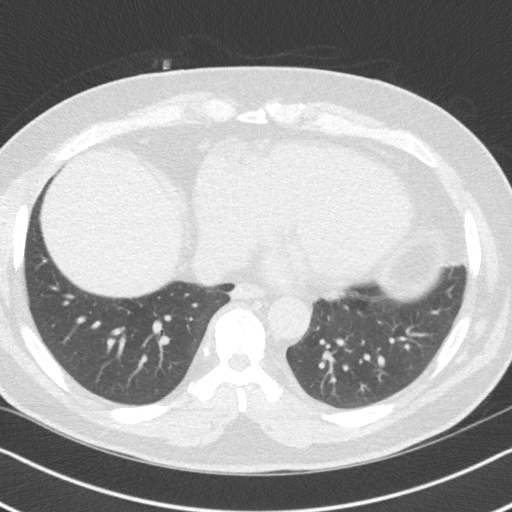
[im 32/57  lung]
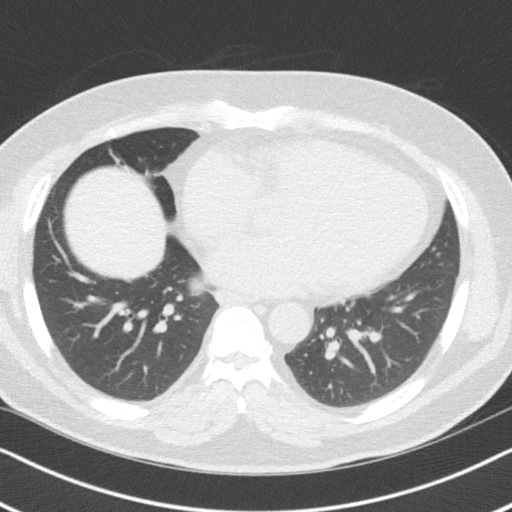
[im 38/57  lung]
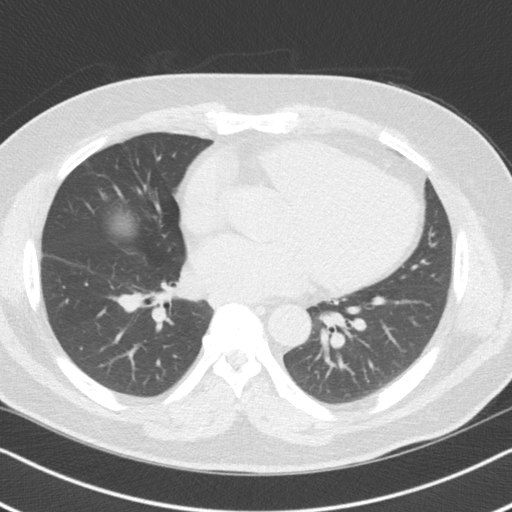
[im 44/57  lung]
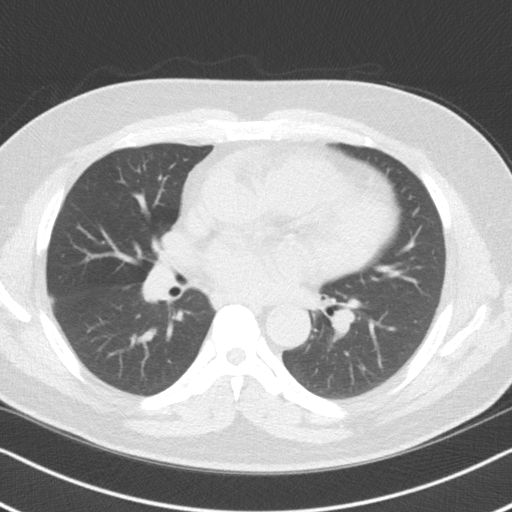
[im 50/57  lung]
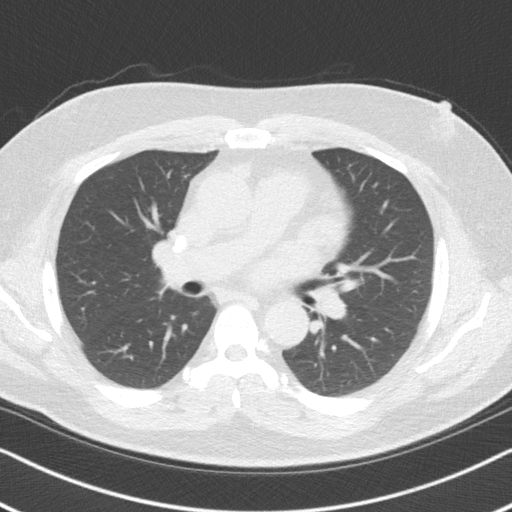

[16 of 20 positions shown; findings below may reference images not displayed]

FINDINGS: Small calcified granuloma in the medial aspect of the right upper
lobe. Within the visualized portions of the thorax there are no
other suspicious appearing pulmonary nodules or masses, there is no
acute consolidative airspace disease, no pleural effusions, no
pneumothorax and no lymphadenopathy. Several densely calcified right
hilar lymph nodes are incidentally noted. Visualized portions of the
upper abdomen are unremarkable. There are no aggressive appearing
lytic or blastic lesions noted in the visualized portions of the
skeleton.
IMPRESSION: 1. No significant incidental noncardiac findings are noted.
FINDINGS: Non-cardiac: See separate report from [REDACTED].

Ascending aorta: Mildly dilated 3.8 cm

Pericardium: Normal

Coronary arteries: Calcium noted isolated to proximal LAD
IMPRESSION: Coronary calcium score of 46. This was 41 st percentile for age and
sex matched control.

Shuhaila Pauline

*** End of Addendum ***

## 2020-09-22 ENCOUNTER — Other Ambulatory Visit: Payer: Self-pay | Admitting: Internal Medicine

## 2020-10-04 ENCOUNTER — Ambulatory Visit (INDEPENDENT_AMBULATORY_CARE_PROVIDER_SITE_OTHER): Payer: BC Managed Care – PPO | Admitting: Cardiovascular Disease

## 2020-10-04 ENCOUNTER — Other Ambulatory Visit: Payer: Self-pay

## 2020-10-04 ENCOUNTER — Encounter: Payer: Self-pay | Admitting: Cardiovascular Disease

## 2020-10-04 VITALS — BP 114/72 | HR 69 | Ht 70.0 in | Wt 236.4 lb

## 2020-10-04 DIAGNOSIS — I10 Essential (primary) hypertension: Secondary | ICD-10-CM | POA: Diagnosis not present

## 2020-10-04 DIAGNOSIS — I5189 Other ill-defined heart diseases: Secondary | ICD-10-CM

## 2020-10-04 DIAGNOSIS — E785 Hyperlipidemia, unspecified: Secondary | ICD-10-CM | POA: Diagnosis not present

## 2020-10-04 DIAGNOSIS — I493 Ventricular premature depolarization: Secondary | ICD-10-CM | POA: Diagnosis not present

## 2020-10-04 DIAGNOSIS — E669 Obesity, unspecified: Secondary | ICD-10-CM

## 2020-10-04 MED ORDER — METOPROLOL TARTRATE 50 MG PO TABS: ORAL_TABLET | ORAL | 2 refills | Status: DC

## 2020-10-04 MED ORDER — ROSUVASTATIN CALCIUM 40 MG PO TABS: 40.0000 mg | ORAL_TABLET | Freq: Every day | ORAL | 2 refills | Status: DC

## 2020-10-04 MED ORDER — SPIRONOLACTONE 25 MG PO TABS: 12.5000 mg | ORAL_TABLET | Freq: Every day | ORAL | 0 refills | Status: DC

## 2020-10-04 NOTE — Patient Instructions (Signed)

## 2020-10-04 NOTE — Progress Notes (Signed)
Cardiology Office Note    Date:  10/04/2020   ID:  Juan Heys, Juan Coleman, DOB 1950/11/18, MRN [id number]  PCP:  Juan Rail, Juan Coleman  Cardiologist:  Shelva Majestic, Juan Coleman   F/U cardiology evaluation initially referred through the courtesy of Dr. Billey Gosling for evaluation of PVCs.  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 Billey Gosling for evaluation of recent ectopy development and demonstration of frequent PVCs.  I saw him for initial evaluation on July 20, 2018.  He presents for a  14 month follow-up evaluation.  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  He has a history of some anxiety for which she has taken sertraline and clonazepam.  He also has mild hyperlipidemia for which she has been on pravastatin 20 mg.  For the past 3 weeks, he has begun to notice episodes of palpitations.  His apple watch had detected PVCs.  He was seen by Dr. Billey Gosling on July 11, 2018.  Due to his recent ectopy, he was scheduled to undergo cardiology evaluation and was initially given an appointment to see another physician at the end of January.  He had called the office and requested to see if I could see him and as a result I added him onto my schedule for assessment.  He denies any chest pain.  He denies any change in exercise tolerance recently.  He typically exercises on elliptical and tries to do some form of activity at least 5 days/week for 30 to 40 minutes if at all possible.  Despite taking atenolol 25 mg he continues to experience the palpitations.  He denies presyncope or syncope.  He is unaware of any salvos of ectopy or sustained arrhythmia.  He is stopped drinking caffeine for the last several days.  Remotely he believes he may have had an episode of atrial fibrillation which lasted for hours in 2010  but was not aware of any recurrence.  He denies any difficulty with sleep.  His sleep is restorative.  He denies frequent nocturia.  He denies daytime sleepiness.    Prior to his initial evaluation, he underwent an echo Doppler study on July 18, 2018 which revealed an EF of 55 to 60%.  He had normal wall motion.  There was grade 2 diastolic dysfunction and Doppler parameters were consistent with elevated ventricular end-diastolic filling pressure.  There was trivial AR, mild MR.  His left atrium was mildly dilated.  PA pressures were normal.    When I initially saw him on July 20, 2018, his ECG showed sinus rhythm at 78 with ventricular bigeminy and evidence for VA conduction with retrograde P waves post PVC.  I reviewed his echo Doppler study in detail which showed normal systolic function but grade 2 diastolic dysfunction and abnormal tissue Doppler.  I reviewed his prior lipid studies which had shown an LDL cholesterol of 135 with total cholesterol 228 and suggested follow-up laboratory be obtained.  I recommended that he discontinue atenolol and initiated metoprolol 25 mg twice a day for 2 days with increase to 50 mg twice a day.  I scheduled him for a 2-week event monitor.  He was monitored from July 21, 2018 through August 03, 2018.  Average heart rate was 75 bpm.  Throughout the monitoring period  despite taking metoprolol 50 twice daily he had isolated PVCs with both bigeminy and had periods where he had up to 16 PVCs and 1 minute and on another occasion 28 PVCs and 1 minute and also had episodes of trigeminal rhythm.  He did not have any ventricular couplets.  There were no episodes of nonsustained VT.  All PVCs were isolated.  His maximal heart rate was sinus tachycardia at 139 bpm which occurred while he was exercising and his slowest heart rate was sinus bradycardia at 50 bpm.  As result, we contacted him to further increase metoprolol to 75 mg twice a day.  He subsequently underwent  repeat laboratory at his office.  LDL cholesterol was improved at 100 with total cholesterol 225 triglycerides 91 and HDL was excellent at 106.  Magnesium was 2.0.  He had very mild transaminase elevation with AST at 44 and ALT at 52.  Vitamin D level was low at 15 and he subsequently has initiated vitamin D replacement.  Creatinine was 1.03 with a potassium of 4.3.  I saw him for follow-up evaluation on August 30, 2018.SInce being on the increased metoprolol dose he hadnoticed improvement in his ectopy and over the past 4 days prior to the evaluationseemedto have resolved. He denies any episodes of chest pain or tightness. Hewasconcerned about his grade 2 diastolic dysfunction. During thatevaluation, his blood pressure was 138/80. With his grade 2 diastolic dysfunction, I recommended the addition of spironolactone and started him at 12.5 mg daily added to his medical regimen.I also reviewed recent laboratory in his LDL cholesterol was 100 which had improved from 135. I recommended CAD screening with coronary calcium score. This was done on September 19, 2018 and showed a calcium score of 46 and suggested evidence of calcification in the proximal LAD.  I had a telemedicine evaluation with him in April 2020.  At that time with the [place] pandemic, he was concerned about taking his losartan. As result for 2 weeks he had selfwithheld treatment of his losartan. His blood pressure had increased to 301-601 systolically. However, he restarted taking losartan several days ago and his blood pressure stabilized. He denied any chest pain. Most of the time his rhythm is sinus without ectopy. However,  he did notice a period of bigeminal rhythm the morning of his visit. He denied any PND orthopnea. During his telemedicine evaluation I recommended he discontinue pravastatin and changed him to rosuvastatin initially at 10 mg daily.  Spironolactone 12.5 mg daily was also increased to twice a day.     He had a follow-up telemedicine evaluation on February 16, 2019.  With his medication adjustment he states his blood pressure has been excellent.  He recently had recheck of his lipid studies which shows improvement with total cholesterol 187, LDL reduced to 85, HDL 88, and triglycerides 68.  He has continued to exercise regularly, walking on elliptical and doing some weight training.  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  He felt well and was was unaware of any significant rhythm abnormality and rarely if any notes an isolated PVC.    I last saw him in January 2021 and since his prior evaluation he continues to feel well.  His palpitations were well controlled on metoprolol 75 mg twice a day.  He continued to be on  amlodipine 10 mg, losartan 100 mg, spironolactone 12.5 mg twice a day in addition to the metoprolol 75 mg twice daily regimen.  His blood pressure has  been stable.  He is unaware of any exercise-induced arrhythmia.  He denies any PND orthopnea.  He has not been successful with weight loss but plans to make major improvement.  He continues to be on rosuvastatin now at 40 mg for hyperlipidemia.  Since I last saw him, he has continued to feel well.  He is unaware of any palpitations.  His blood pressure has been stable and he is now taking spironolactone just 12.5 mg daily.  Not had recent laboratory with reference to lipid studies since February 2021 which showed total cholesterol 152 LDL cholesterol 48 triglycerides 89 HDL 88.  Continues to exercise he denies any significant weight loss.  He denies chest pain PND orthopnea palpitations presyncope or syncope.  He presents for evaluation.  Past Medical History:  Diagnosis Date  . Hyperlipidemia   . Hypertension     Past Surgical History:  Procedure Laterality Date  . APPENDECTOMY    . COLONOSCOPY  2003 & 2015   Dr Cristina Gong  . LAPAROSCOPIC RETROPUBIC PROSTATECTOMY  2008   Dr Alinda Money  . UPPER GI ENDOSCOPY  2015   twice     Current Medications: Outpatient Medications Prior to Visit  Medication Sig Dispense Refill  . amLODipine (NORVASC) 10 MG tablet TAKE 1 TABLET BY MOUTH EVERY DAY 90 tablet 1  . losartan (COZAAR) 100 MG tablet TAKE 1 TABLET BY MOUTH EVERY DAY. 90 tablet 2  . sertraline (ZOLOFT) 50 MG tablet TAKE 1 TABLET BY MOUTH EVERY DAY 90 tablet 3  . zolpidem (AMBIEN) 10 MG tablet Take 1 tablet (10 mg total) by mouth at bedtime as needed for sleep. 30 tablet 5  . metoprolol tartrate (LOPRESSOR) 50 MG tablet TAKE 1 & 1/2 TABLETS (75 MG TOTAL) BY MOUTH 2 (TWO) TIMES DAILY. 270 tablet 2  . rosuvastatin (CRESTOR) 40 MG tablet TAKE 1 TABLET BY MOUTH EVERY DAY 90 tablet 2  . spironolactone (ALDACTONE) 25 MG tablet TAKE 1/2 TABLET BY MOUTH EVERY DAY 90 tablet 0   No facility-administered medications prior to visit.     Allergies:   Patient has no active allergies.   Social History   Socioeconomic History  . Marital status: Married    Spouse name: Not on file  . Number of children: Not on file  . Years of education: Not on file  . Highest education level: Not on file  Occupational History  . Not on file  Tobacco Use  . Smoking status: Former Research scientist (life sciences)  . Smokeless tobacco: Never Used  Vaping Use  . Vaping Use: Never used  Substance and Sexual Activity  . Alcohol use: Yes  . Drug use: No  . Sexual activity: Not on file  Other Topics Concern  . Not on file  Social History Narrative  . Not on file   Social Determinants of Health   Financial Resource Strain: Not on file  Food Insecurity: Not on file  Transportation Needs: Not on file  Physical Activity: Not on file  Stress: Not on file  Social Connections: Not on file    Socially he is married for 41 years.  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  He has 1 living daughter.  Unfortunately, his son is  deceased.  He is expecting his first grandchild.  Family History:  The patient's family history includes Hypertension in  his mother; Prostate cancer (age of onset: 61) in his father; Stroke in his mother.   His mother died at age 28 and had hypertension.  His father died at age 54 and had prostate CA.  He has 1 sister.  ROS General: Negative; No fevers, chills, or night sweats;  HEENT: Negative; No changes in vision or hearing, sinus congestion, difficulty swallowing Pulmonary: Negative; No cough, wheezing, shortness of breath, hemoptysis Cardiovascular: See HPI GI: Negative; No nausea, vomiting, diarrhea, or abdominal pain GU: Negative; No dysuria, hematuria, or difficulty voiding Musculoskeletal: Negative; no myalgias, joint pain, or weakness Hematologic/Oncology: Negative; no easy bruising, bleeding Endocrine: Negative; no heat/cold intolerance; no diabetes Neuro: Negative; no changes in balance, headaches Skin: Negative; No rashes or skin lesions Psychiatric: Occasional anxiety Sleep: Negative; No snoring, daytime sleepiness, hypersomnolence, bruxism, restless legs, hypnogognic hallucinations, no cataplexy Other comprehensive 14 point system review is negative.   PHYSICAL EXAM:   VS:  BP 114/72   Pulse 69   Ht $R'5\' 10"'KT$  (1.778 m)   Wt 236 lb 6.4 oz (107.2 kg)   SpO2 97%   BMI 33.92 kg/m     Repeat blood pressure by me was 114/70 supine and 110/72 standing  Wt Readings from Last 3 Encounters:  10/04/20 236 lb 6.4 oz (107.2 kg)  04/02/20 227 lb (103 kg)  08/18/19 228 lb (103.4 kg)    General: Alert, oriented, no distress.  Since his last evaluation weight has increased by 8 pounds. Skin: normal turgor, no rashes, warm and dry HEENT: Normocephalic, atraumatic. Pupils equal round and reactive to light; sclera anicteric; extraocular muscles intact;  Nose without nasal septal hypertrophy Mouth/Parynx benign; Mallinpatti scale documented to be 3 Neck: No JVD, no carotid bruits;  normal carotid upstroke Lungs: clear to ausculatation and percussion; no wheezing or rales Chest wall: without tenderness to palpitation Heart: PMI not displaced, RRR, s1 s2 normal, 1/6 systolic murmur, no diastolic murmur, no rubs, gallops, thrills, or heaves Abdomen: soft, nontender; no hepatosplenomehaly, BS+; abdominal aorta nontender and not dilated by palpation. Back: no CVA tenderness Pulses 2+ Musculoskeletal: full range of motion, normal strength, no joint deformities Extremities: no clubbing cyanosis or edema, Homan's sign negative  Neurologic: grossly nonfocal; Cranial nerves grossly wnl Psychologic: Normal mood and affect    Studies/Labs Reviewed:    ECG (independently read by me): Sinus bradycardia at 58, isolated PVC x2; QTc 415 msec  January 2021 ECG (independently read by me): Sinus rhythm at 66 bpm with isolated PVCs in a trigeminal pattern.  Left axis  deviation low voltage frontal leads QTc interval 4 4 ms, PR interval 178 ms  January 2020 ECG (independently read by me): Normal sinus rhythm at 63 bpm.  No ectopy.  PR of 182 ms, QTc interval 421 ms.  Poor anterior R wave progression.  July 20, 2018 ECG (independently read by me): Sinus rhythm at 78 bpm with ventricular trigeminy and evidence for VA conduction with retrograde P waves.  PR interval 180 ms, QTc interval 442 ms.  Poor anterior R wave progression.  Possible left atrial enlargement.  Recent Labs: BMP Latest Ref Rng & Units 12/31/2017 10/23/2016 08/21/2013  Glucose 70 - 99 mg/dL 124(H) 108(H) 101(H)  BUN 6 - 23 mg/dL $Remove'18 19 14  'JztQGYi$ Creatinine 0.40 - 1.50 mg/dL 1.05 1.02 0.9  Sodium 135 - 145 mEq/L 140 139 137  Potassium 3.5 - 5.1 mEq/L 4.6 4.7 3.8  Chloride 96 - 112 mEq/L 106 105 103  CO2 19 - 32 mEq/L  $'26 25 24  'b$ Calcium 8.4 - 10.5 mg/dL 9.6 10.0 9.5     Hepatic Function Latest Ref Rng & Units 12/31/2017 10/23/2016 08/21/2013  Total Protein 6.0 - 8.3 g/dL 8.0 7.8 8.3  Albumin 3.5 - 5.2 g/dL 4.5 4.6 4.5  AST  0 - 37 U/L 29 33 61(H)  ALT 0 - 53 U/L 32 29 88(H)  Alk Phosphatase 39 - 117 U/L 67 52 58  Total Bilirubin 0.2 - 1.2 mg/dL 0.6 1.0 1.0  Bilirubin, Direct 0.0 - 0.3 mg/dL - - 0.1    CBC Latest Ref Rng & Units 12/31/2017 10/23/2016 08/21/2013  WBC 4.0 - 10.5 K/uL 6.7 7.3 7.4  Hemoglobin 13.0 - 17.0 g/dL 13.8 14.6 14.3  Hematocrit 39.0 - 52.0 % 41.1 42.9 42.5  Platelets 150.0 - 400.0 K/uL 260.0 274.0 278.0   Lab Results  Component Value Date   MCV 94.2 12/31/2017   MCV 94.5 10/23/2016   MCV 96.1 08/21/2013   Lab Results  Component Value Date   TSH 0.91 12/31/2017   Lab Results  Component Value Date   HGBA1C 5.7 01/03/2018     BNP No results found for: BNP  ProBNP No results found for: PROBNP   Lipid Panel     Component Value Date/Time   CHOL 228 (H) 12/31/2017 0858   TRIG 64.0 12/31/2017 0858   HDL 80.60 12/31/2017 0858   CHOLHDL 3 12/31/2017 0858   VLDL 12.8 12/31/2017 0858   LDLCALC 135 (H) 12/31/2017 0858   LDLDIRECT 161.2 12/29/2007 0000     RADIOLOGY: No results found.   Additional studies/ records that were reviewed today include:  I again have reviewed his prior echo Doppler study.  I extensively reviewed his 2-week event monitor as well as laboratory which he had obtained at his office.   07/18/2018 ECHO Study Conclusions  - Left ventricle: The cavity size was normal. Systolic function was   normal. The estimated ejection fraction was in the range of 55%   to 60%. Wall motion was normal; there were no regional wall   motion abnormalities. Features are consistent with a pseudonormal   left ventricular filling pattern, with concomitant abnormal   relaxation and increased filling pressure (grade 2 diastolic   dysfunction). Doppler parameters are consistent with elevated   ventricular end-diastolic filling pressure. - Aortic valve: There was trivial regurgitation. - Mitral valve: There was mild regurgitation. - Left atrium: The atrium was mildly  dilated. - Right ventricle: Systolic function was normal. - Right atrium: The atrium was normal in size. - Pulmonary arteries: Systolic pressure was within the normal   range. - Inferior vena cava: The vessel was normal in size. - Pericardium, extracardiac: There was no pericardial effusion.  2 Week Event Monitor:07/21/2018  Dr. Radene Knee was monitored on July 21, 2018 through August 03, 2018. Average heart rate was proximately 75 bpm. Throughout the monitoring period, he had frequent isolated PVCs had both bigeminy with up to 16 PVCs and 1 minute on another occasion 28 PVCs and 1 minute and also had episodes of trigeminal rhythm with his episodes of 22, 24, and 25 occurring in 1 minute. He did not have any ventricular couplets. There were no episodes of nonsustained ventricular tachycardia. All PVCs were isolated. He did not have any significant pauses. His maximal heart rate was sinus tachycardia at 139 bpm which occurred at 5:24 PM on August 01, 2018 and sinus bradycardia at 50 bpm which occurred on December 29 at 7:44 AM.  ASSESSMENT:    1. Essential hypertension   2. Hyperlipidemia with target LDL less than 70   3. Grade II diastolic dysfunction   4. Ventricular ectopy with PVC, bigeminy and trigeminy; improved with titration of metoprolol   5. 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  I initially saw him in December after he had experienced 3 weeks of  frequent ectopy which has been documented to be frequent PVCs.  He has been documented to have a bigeminal as well as trigeminal pattern intermittently.  He has remained fairly active.  His palpitations did not improve when he held his atenolol for several days and therefore resumed atenolol without significant benefit.   His ECG at that time showed sinus rhythm with frequent ventricular bigeminy with VA conduction with retrograde P waves post PVC.  An echo Doppler study showed normal systolic function with grade 2 diastolic dysfunction and abnormal tissue Doppler suggesting increased LA filling pressure.  His 2-week event monitor continues to show frequent ventricular ectopy with bigeminy and trigeminy despite being changed to metoprolol 50 mg twice a day with discontinuance of atenolol.  However, since his dose was further titrated to 75 mg twice a day his ectopy is significantly improved and essentially resolved.  His only, blood pressure continues to be stable without orthostatic drop on his regimen consisting of amlodipine 10 mg, losartan 100 mg, metoprolol tartrate 75 mg twice a day and spironolactone 12.5 mg daily.  His ECG today showed a stable rhythm to isolated PVCs.  On prolonged auscultation, I did not hear any ectopy.  He has not had recent fasting laboratory and I suggested that he obtain new labs at his office and sent to [place].  He continues to be on rosuvastatin 40 mg daily in February 2021 LDL dropped to 48.  I will contact him regarding laboratory.  We discussed opportunity to increase exercise and weight loss.  As long as he is stable I will see him in 1 year for reevaluation or sooner as needed.   Medication Adjustments/Labs and Tests Ordered: Current medicines are reviewed at length with the patient today.  Concerns regarding medicines are outlined above.  Medication changes, Labs and Tests ordered today are listed in the Patient Instructions below. Patient Instructions  Medication Instructions:  The current medical regimen is effective;  continue present plan and medications.  *If you need a refill on your cardiac medications before your next appointment, please call your pharmacy*   Follow-Up: At [facility], you and your health needs are our priority.  As part of our continuing mission to provide  you with exceptional heart care, we have created designated Provider Care Teams.  These Care Teams include your primary Cardiologist (physician) and Advanced Practice Providers (APPs -  Physician Assistants and Nurse Practitioners) who all work together to provide you with the care you need, when you need it.  We recommend signing up for the patient portal called "MyChart".  Sign up information is provided on this After Visit Summary.  MyChart is used to connect with patients for Virtual Visits (Telemedicine).  Patients are able to view lab/test results, encounter notes, upcoming appointments, etc.  Non-urgent messages can be sent to your provider as well.   To learn more about what you  can do with MyChart, go to [email or web address].    Your next appointment:   12 month(s)  The format for your next appointment:   In Person  Provider:   Shelva Majestic, Juan Coleman        Signed, Shelva Majestic, Juan Coleman  10/04/2020 12:50 PM    [facility] [address] [place], [place], [place]  [address] Phone: [phone]

## 2020-12-18 ENCOUNTER — Other Ambulatory Visit: Payer: Self-pay | Admitting: Internal Medicine

## 2020-12-26 DIAGNOSIS — Z125 Encounter for screening for malignant neoplasm of prostate: Secondary | ICD-10-CM | POA: Diagnosis not present

## 2020-12-26 DIAGNOSIS — Z1329 Encounter for screening for other suspected endocrine disorder: Secondary | ICD-10-CM | POA: Diagnosis not present

## 2020-12-26 DIAGNOSIS — Z13228 Encounter for screening for other metabolic disorders: Secondary | ICD-10-CM | POA: Diagnosis not present

## 2020-12-26 DIAGNOSIS — Z13220 Encounter for screening for lipoid disorders: Secondary | ICD-10-CM | POA: Diagnosis not present

## 2021-02-19 ENCOUNTER — Other Ambulatory Visit: Payer: Self-pay | Admitting: Internal Medicine

## 2021-02-21 DIAGNOSIS — K2270 Barrett's esophagus without dysplasia: Secondary | ICD-10-CM | POA: Diagnosis not present

## 2021-03-04 ENCOUNTER — Other Ambulatory Visit: Payer: Self-pay | Admitting: Internal Medicine

## 2021-03-16 ENCOUNTER — Other Ambulatory Visit: Payer: Self-pay | Admitting: Internal Medicine

## 2021-04-03 NOTE — Patient Instructions (Addendum)
Flu vaccine today.  Medications changes include :   none   Please followup in 1 year   Health Maintenance, Male Adopting a healthy lifestyle and getting preventive care are important in promoting health and wellness. Ask your health care provider about: The right schedule for you to have regular tests and exams. Things you can do on your own to prevent diseases and keep yourself healthy. What should I know about diet, weight, and exercise? Eat a healthy diet  Eat a diet that includes plenty of vegetables, fruits, low-fat dairy products, and lean protein. Do not eat a lot of foods that are high in solid fats, added sugars, or sodium. Maintain a healthy weight Body mass index (BMI) is a measurement that can be used to identify possible weight problems. It estimates body fat based on height and weight. Your health care provider can help determine your BMI and help you achieve or maintain a healthy weight. Get regular exercise Get regular exercise. This is one of the most important things you can do for your health. Most adults should: Exercise for at least 150 minutes each week. The exercise should increase your heart rate and make you sweat (moderate-intensity exercise). Do strengthening exercises at least twice a week. This is in addition to the moderate-intensity exercise. Spend less time sitting. Even light physical activity can be beneficial. Watch cholesterol and blood lipids Have your blood tested for lipids and cholesterol at 68 years of age, then have this test every 5 years. You may need to have your cholesterol levels checked more often if: Your lipid or cholesterol levels are high. You are older than 68 years of age. You are at high risk for heart disease. What should I know about cancer screening? Many types of cancers can be detected early and may often be prevented. Depending on your health history and family history, you may need to have cancer screening at various  ages. This may include screening for: Colorectal cancer. Prostate cancer. Skin cancer. Lung cancer. What should I know about heart disease, diabetes, and high blood pressure? Blood pressure and heart disease High blood pressure causes heart disease and increases the risk of stroke. This is more likely to develop in people who have high blood pressure readings, are of African descent, or are overweight. Talk with your health care provider about your target blood pressure readings. Have your blood pressure checked: Every 3-5 years if you are 64-58 years of age. Every year if you are 49 years old or older. If you are between the ages of 77 and 67 and are a current or former smoker, ask your health care provider if you should have a one-time screening for abdominal aortic aneurysm (AAA). Diabetes Have regular diabetes screenings. This checks your fasting blood sugar level. Have the screening done: Once every three years after age 43 if you are at a normal weight and have a low risk for diabetes. More often and at a younger age if you are overweight or have a high risk for diabetes. What should I know about preventing infection? Hepatitis B If you have a higher risk for hepatitis B, you should be screened for this virus. Talk with your health care provider to find out if you are at risk for hepatitis B infection. Hepatitis C Blood testing is recommended for: Everyone born from [birth year] through 1963. Anyone with known risk factors for hepatitis C. Sexually transmitted infections (STIs) You should be screened each year for STIs, including gonorrhea and  chlamydia, if: You are sexually active and are younger than 68 years of age. You are older than 68 years of age and your health care provider tells you that you are at risk for this type of infection. Your sexual activity has changed since you were last screened, and you are at increased risk for chlamydia or gonorrhea. Ask your health care provider  if you are at risk. Ask your health care provider about whether you are at high risk for HIV. Your health care provider may recommend a prescription medicine to help prevent HIV infection. If you choose to take medicine to prevent HIV, you should first get tested for HIV. You should then be tested every 3 months for as long as you are taking the medicine. Follow these instructions at home: Lifestyle Do not use any products that contain nicotine or tobacco, such as cigarettes, e-cigarettes, and chewing tobacco. If you need help quitting, ask your health care provider. Do not use street drugs. Do not share needles. Ask your health care provider for help if you need support or information about quitting drugs. Alcohol use Do not drink alcohol if your health care provider tells you not to drink. If you drink alcohol: Limit how much you have to 0-2 drinks a day. Be aware of how much alcohol is in your drink. In the [place] one drink equals one 12 oz bottle of beer (355 mL), one 5 oz glass of wine (148 mL), or one 1 oz glass of hard liquor (44 mL). General instructions Schedule regular health, dental, and eye exams. Stay current with your vaccines. Tell your health care provider if: You often feel depressed. You have ever been abused or do not feel safe at home. Summary Adopting a healthy lifestyle and getting preventive care are important in promoting health and wellness. Follow your health care provider's instructions about healthy diet, exercising, and getting tested or screened for diseases. Follow your health care provider's instructions on monitoring your cholesterol and blood pressure. This information is not intended to replace advice given to you by your health care provider. Make sure you discuss any questions you have with your health care provider. Document Revised: 09/27/2020 Document Reviewed: 07/13/2018 Elsevier Patient Education  2022 [employer].

## 2021-04-03 NOTE — Progress Notes (Signed)
Subjective:    Patient ID: Juan Balding, MD, male    DOB: 1951/04/17, 70 y.o.   MRN: [id number]   This visit occurred during the SARS-CoV-2 public health emergency.  Safety protocols were in place, including screening questions prior to the visit, additional usage of staff PPE, and extensive cleaning of exam room while observing appropriate contact time as indicated for disinfecting solutions.   HPI He is here for a physical exam.   He is doing well overall.  No complaints.    Medications and allergies reviewed with patient and updated if appropriate.  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 270 tablet 2   rosuvastatin (CRESTOR) 40 MG tablet Take 1 tablet (40 mg total) by mouth daily. 90 tablet 2   sertraline (ZOLOFT) 50 MG tablet TAKE 1 TABLET BY MOUTH EVERY DAY 90 tablet 3   spironolactone (ALDACTONE) 25 MG tablet Take 0.5 tablets (12.5 mg total) by mouth daily. 90 tablet 0   zolpidem (AMBIEN) 10 MG tablet TAKE 1 TABLET BY MOUTH AT BEDTIME AS NEEDED FOR SLEEP. 30 tablet 5   No current facility-administered medications on file prior to visit.    Past Medical History:   Diagnosis Date   Hyperlipidemia    Hypertension     Past Surgical History:  Procedure Laterality Date   APPENDECTOMY     COLONOSCOPY  2003 & 2015   Dr Matthias Hughs   LAPAROSCOPIC RETROPUBIC PROSTATECTOMY  2008   Dr Laverle Patter   UPPER GI ENDOSCOPY  2015   twice    Social History   Socioeconomic History   Marital status: Married    Spouse name: Not on file   Number of children: Not on file   Years of education: Not on file   Highest education level: Not on file  Occupational History   Not on file  Tobacco Use   Smoking status: Former   Smokeless tobacco: Never  Vaping Use   Vaping Use: Never used  Substance and Sexual Activity   Alcohol use: Yes   Drug use: No   Sexual activity: Not on file  Other Topics Concern   Not on file  Social History Narrative   Not on file   Social Determinants of Health   Financial Resource Strain: Not on file  Food Insecurity: Not on file  Transportation Needs: Not on file  Physical Activity: Not on file  Stress: Not on file  Social Connections: Not on file    Family History  Problem Relation Age of Onset   Stroke Mother        in 65s; died of SDH   Hypertension Mother    Prostate  cancer Father 58   Diabetes Neg Hx    Heart disease Neg Hx     Review of Systems     Objective:   Vitals:   04/04/21 1259  BP: 132/72  Pulse: 63  Temp: 98.2 F (36.8 C)  SpO2: 96%   Filed Weights   04/04/21 1259  Weight: 243 lb (110.2 kg)   Body mass index is 34.87 kg/m.  BP Readings from Last 3 Encounters:  04/04/21 132/72  10/04/20 114/72  04/02/20 112/68    Wt Readings from Last 3 Encounters:  04/04/21 243 lb (110.2 kg)  10/04/20 236 lb 6.4 oz (107.2 kg)  04/02/20 227 lb (103 kg)   Depression screen [facility] 2/9 04/04/2021 03/24/2019 12/31/2017  Decreased Interest 0 0 0  Down, Depressed, Hopeless 0 0 0  PHQ - 2 Score 0 0 0  Altered sleeping 1 - -  Tired, decreased energy 1 - -  Change in appetite 1 - -  Feeling bad or failure  about yourself  0 - -  Trouble concentrating 0 - -  Moving slowly or fidgety/restless 0 - -  Suicidal thoughts 0 - -  PHQ-9 Score 3 - -  Difficult doing work/chores Not difficult at all - -    GAD 7 : Generalized Anxiety Score 04/04/2021  Nervous, Anxious, on Edge 1  Control/stop worrying 1  Worry too much - different things 1  Trouble relaxing 1  Restless 0  Easily annoyed or irritable 0  Afraid - awful might happen 0  Total GAD 7 Score 4  Anxiety Difficulty Not difficult at all       Physical Exam Constitutional: He appears well-developed and well-nourished. No distress.  HENT:  Head: Normocephalic and atraumatic.  Right Ear: External ear normal.  Left Ear: External ear normal.  Mouth/Throat: Oropharynx is clear and moist.  Normal ear canals and TM b/l  Eyes: Conjunctivae and EOM are normal.  Neck: Neck supple. No tracheal deviation present. No thyromegaly present.  No carotid bruit  Cardiovascular: Normal rate, regular rhythm, normal heart sounds and intact distal pulses.   No murmur heard. Pulmonary/Chest: Effort normal and breath sounds normal. No respiratory distress. He has no wheezes. He has no rales.  Abdominal: Soft. He exhibits no distension. There is no tenderness.  Genitourinary: deferred  Musculoskeletal: He exhibits no edema.  Lymphadenopathy:   He has no cervical adenopathy.  Skin: Skin is warm and dry. He is not diaphoretic.  Psychiatric: He has a normal mood and affect. His behavior is normal.         Assessment & Plan:   Physical exam: Screening blood work  ordered Exercise   not regular-we discussed the importance of regular exercise Weight he knows he needs to work on weight loss Substance abuse   none  Screened for depression using the PHQ 9 scale.  No evidence of depression.   Reviewed recommended immunizations.   Health Maintenance  Topic Date Due   Zoster Vaccines- Shingrix (1 of 2) Never done   COVID-19 Vaccine (4 - Booster for  Pfizer series) 08/26/2020   INFLUENZA VACCINE  03/03/2021   TETANUS/TDAP  08/03/2021   COLONOSCOPY (Pts 45-59yrs Insurance coverage will need to be confirmed)  10/19/2023   Hepatitis C Screening  Completed   PNA vac Low Risk Adult  Completed   HPV VACCINES  Aged Out     See Problem List for Assessment and Plan of chronic medical problems.

## 2021-04-04 ENCOUNTER — Ambulatory Visit (INDEPENDENT_AMBULATORY_CARE_PROVIDER_SITE_OTHER): Payer: BC Managed Care – PPO | Admitting: Internal Medicine

## 2021-04-04 ENCOUNTER — Encounter: Payer: Self-pay | Admitting: Internal Medicine

## 2021-04-04 ENCOUNTER — Other Ambulatory Visit: Payer: Self-pay

## 2021-04-04 VITALS — BP 132/72 | HR 63 | Temp 98.2°F | Ht 70.0 in | Wt 243.0 lb

## 2021-04-04 DIAGNOSIS — E782 Mixed hyperlipidemia: Secondary | ICD-10-CM

## 2021-04-04 DIAGNOSIS — K22719 Barrett's esophagus with dysplasia, unspecified: Secondary | ICD-10-CM

## 2021-04-04 DIAGNOSIS — Z23 Encounter for immunization: Secondary | ICD-10-CM | POA: Diagnosis not present

## 2021-04-04 DIAGNOSIS — G4709 Other insomnia: Secondary | ICD-10-CM | POA: Diagnosis not present

## 2021-04-04 DIAGNOSIS — Z0000 Encounter for general adult medical examination without abnormal findings: Secondary | ICD-10-CM | POA: Diagnosis not present

## 2021-04-04 DIAGNOSIS — I493 Ventricular premature depolarization: Secondary | ICD-10-CM

## 2021-04-04 DIAGNOSIS — Z1331 Encounter for screening for depression: Secondary | ICD-10-CM | POA: Diagnosis not present

## 2021-04-04 DIAGNOSIS — I10 Essential (primary) hypertension: Secondary | ICD-10-CM

## 2021-04-04 DIAGNOSIS — F419 Anxiety disorder, unspecified: Secondary | ICD-10-CM

## 2021-04-04 DIAGNOSIS — Z8546 Personal history of malignant neoplasm of prostate: Secondary | ICD-10-CM

## 2021-04-04 DIAGNOSIS — E559 Vitamin D deficiency, unspecified: Secondary | ICD-10-CM

## 2021-04-04 NOTE — Assessment & Plan Note (Signed)
Chronic Following with urology 

## 2021-04-04 NOTE — Assessment & Plan Note (Signed)
Chronic BP well controlled Continue amlodipine 10 mg daily, losartan 100 mg daily, metoprolol 75 mg twice daily, spironolactone 12.5 mg daily

## 2021-04-04 NOTE — Assessment & Plan Note (Signed)
Chronic Just recently had endoscopy and it showed Barrett's without dysplasia Repeat EGD in 02/2024

## 2021-04-04 NOTE — Assessment & Plan Note (Signed)
Chronic Check lipid panel, CMP-we will have blood work done at work Continue Crestor 40 mg daily Regular exercise and healthy diet encouraged

## 2021-04-04 NOTE — Assessment & Plan Note (Signed)
Chronic Controlled Intermittent insomnia Continue Ambien 10 mg nightly as needed

## 2021-04-04 NOTE — Assessment & Plan Note (Signed)
Chronic Following with cardiology Well-controlled with metoprolol 75 mg twice daily

## 2021-04-04 NOTE — Assessment & Plan Note (Addendum)
Chronic Fairly controlled, but still has some anxiety He feels the medication is adequate and working well Continue sertraline 50 mg daily

## 2021-04-10 DIAGNOSIS — H40013 Open angle with borderline findings, low risk, bilateral: Secondary | ICD-10-CM | POA: Diagnosis not present

## 2021-04-16 ENCOUNTER — Other Ambulatory Visit: Payer: Self-pay | Admitting: Cardiovascular Disease

## 2021-08-15 ENCOUNTER — Other Ambulatory Visit: Payer: Self-pay | Admitting: Internal Medicine

## 2021-09-12 ENCOUNTER — Other Ambulatory Visit: Payer: Self-pay | Admitting: Cardiovascular Disease

## 2021-09-12 ENCOUNTER — Other Ambulatory Visit: Payer: Self-pay | Admitting: Internal Medicine

## 2021-09-20 ENCOUNTER — Other Ambulatory Visit: Payer: Self-pay | Admitting: Cardiovascular Disease

## 2021-12-18 ENCOUNTER — Ambulatory Visit: Payer: BC Managed Care – PPO | Admitting: Cardiovascular Disease

## 2022-01-02 ENCOUNTER — Encounter: Payer: Self-pay | Admitting: Nurse Practitioner

## 2022-01-02 ENCOUNTER — Ambulatory Visit (INDEPENDENT_AMBULATORY_CARE_PROVIDER_SITE_OTHER): Payer: 59 | Admitting: Nurse Practitioner

## 2022-01-02 VITALS — BP 122/72 | HR 57 | Ht 70.0 in | Wt 229.2 lb

## 2022-01-02 DIAGNOSIS — I10 Essential (primary) hypertension: Secondary | ICD-10-CM

## 2022-01-02 DIAGNOSIS — E785 Hyperlipidemia, unspecified: Secondary | ICD-10-CM

## 2022-01-02 DIAGNOSIS — I493 Ventricular premature depolarization: Secondary | ICD-10-CM | POA: Diagnosis not present

## 2022-01-02 DIAGNOSIS — I5189 Other ill-defined heart diseases: Secondary | ICD-10-CM | POA: Diagnosis not present

## 2022-01-02 NOTE — Patient Instructions (Signed)
Medication Instructions:  Your physician recommends that you continue on your current medications as directed. Please refer to the Current Medication list given to you today.   *If you need a refill on your cardiac medications before your next appointment, please call your pharmacy*   Lab Work: NONE ordered at this time of appointment   If you have labs (blood work) drawn today and your tests are completely normal, you will receive your results only by: MyChart Message (if you have MyChart) OR A paper copy in the mail If you have any lab test that is abnormal or we need to change your treatment, we will call you to review the results.   Testing/Procedures: NONE ordered at this time of appointment     Follow-Up: At [facility], you and your health needs are our priority.  As part of our continuing mission to provide you with exceptional heart care, we have created designated Provider Care Teams.  These Care Teams include your primary Cardiologist (physician) and Advanced Practice Providers (APPs -  Physician Assistants and Nurse Practitioners) who all work together to provide you with the care you need, when you need it.  We recommend signing up for the patient portal called "MyChart".  Sign up information is provided on this After Visit Summary.  MyChart is used to connect with patients for Virtual Visits (Telemedicine).  Patients are able to view lab/test results, encounter notes, upcoming appointments, etc.  Non-urgent messages can be sent to your provider as well.   To learn more about what you can do with MyChart, go to [email or web address].    Your next appointment:   1 year(s)  The format for your next appointment:   In Person  Provider:   Thomas Kelly, MD     Other Instructions   Important Information About Sugar       

## 2022-01-02 NOTE — Progress Notes (Signed)
Office Visit    Patient Name: Juan Balding, MD Date of Encounter: 01/02/2022  Primary Care Provider:  Pincus Sanes, MD Primary Cardiologist:  Nicki Guadalajara, MD  Chief Complaint    71 year old male with a history of hypertension, palpitations, PVCs, grade 2 diastolic dysfunction, and hyperlipidemia who presents for follow-up related to hypertension and palpitations.  Past Medical History    Past Medical History:  Diagnosis Date   Hyperlipidemia    Hypertension    Past Surgical History:  Procedure Laterality Date   APPENDECTOMY     COLONOSCOPY  2003 & 2015   Dr Matthias Hughs   LAPAROSCOPIC RETROPUBIC PROSTATECTOMY  2008   Dr Laverle Patter   UPPER GI ENDOSCOPY  2015   twice    Allergies  No Active Allergies  History of Present Illness    71 year old male with the above past medical history including hypertension, palpitations, PVCs, grade 2 diastolic dysfunction, and hyperlipidemia.  He was initially evaluated by Dr. Tresa Endo in 2019 in the setting of abnormal EKG which showed ventricular bigeminy and evidence for VA conduction with retrograde P waves post PVC. Outpatient cardiac monitor in December 2019 revealed frequent PVCs, intermittent bigeminy, trigeminy, no other significant arrhythmias.  Echocardiogram at the time showed EF 55 to 60%, no RWMA, grade 2 DD, no significant valvular abnormalities. CT cardiac scoring in 2020 showed coronary calcium 446, 41st percentile for age and sex matched control, calcium noted isolated to proximal LAD.  Ectopy improved with metoprolol. He was last seen in the office on 10/04/2020 and was stable from a cardiac standpoint.  He denied any significant palpitations.  BP was well controlled.  He presents today for follow-up. Since his last visit he has done well from a cardiac standpoint.  He continues to practice as a GYN. He denies palpitations, dyspnea, edema, PND, orthopnea.  He denies symptoms concerning for angina. Overall, he reports feeling  well and denies any new concerns today.  Home Medications    Current Outpatient Medications  Medication Sig Dispense Refill   amLODipine (NORVASC) 10 MG tablet TAKE 1 TABLET BY MOUTH EVERY DAY 90 tablet 1   losartan (COZAAR) 100 MG tablet TAKE 1 TABLET BY MOUTH EVERY DAY 90 tablet 2   metoprolol tartrate (LOPRESSOR) 50 MG tablet TAKE 1 & 1/2 TABLETS (75 MG TOTAL) BY MOUTH 2 (TWO) TIMES DAILY. 270 tablet 2   rosuvastatin (CRESTOR) 40 MG tablet TAKE 1 TABLET BY MOUTH EVERY DAY 90 tablet 2   sertraline (ZOLOFT) 50 MG tablet TAKE 1 TABLET BY MOUTH EVERY DAY 90 tablet 3   spironolactone (ALDACTONE) 25 MG tablet TAKE 1/2 TABLET BY MOUTH EVERY DAY 45 tablet 2   zolpidem (AMBIEN) 10 MG tablet TAKE 1 TABLET BY MOUTH EVERY DAY AT BEDTIME AS NEEDED FOR SLEEP 30 tablet 5   No current facility-administered medications for this visit.     Review of Systems    He denies chest pain, palpitations, dyspnea, pnd, orthopnea, n, v, dizziness, syncope, edema, weight gain, or early satiety. All other systems reviewed and are otherwise negative except as noted above.   Physical Exam    VS:  BP 122/72   Pulse (!) 57   Ht 5\' 10"  (1.778 m)   Wt 229 lb 3.2 oz (104 kg)   SpO2 97%   BMI 32.89 kg/m   GEN: Well nourished, well developed, in no acute distress. HEENT: normal. Neck: Supple, no JVD, carotid bruits, or masses. Cardiac: RRR, no murmurs, rubs, or  gallops. No clubbing, cyanosis, edema.  Radials/DP/PT 2+ and equal bilaterally.  Respiratory:  Respirations regular and unlabored, clear to auscultation bilaterally. GI: Soft, nontender, nondistended, BS + x 4. MS: no deformity or atrophy. Skin: warm and dry, no rash. Neuro:  Strength and sensation are intact. Psych: Normal affect.  Accessory Clinical Findings    ECG personally reviewed by me today -sinus bradycardia, 57 bpm- no acute changes.  Lab Results  Component Value Date   WBC 6.7 12/31/2017   HGB 13.8 12/31/2017   HCT 41.1 12/31/2017    MCV 94.2 12/31/2017   PLT 260.0 12/31/2017   Lab Results  Component Value Date   CREATININE 1.05 12/31/2017   BUN 18 12/31/2017   NA 140 12/31/2017   K 4.6 12/31/2017   CL 106 12/31/2017   CO2 26 12/31/2017   Lab Results  Component Value Date   ALT 32 12/31/2017   AST 29 12/31/2017   ALKPHOS 67 12/31/2017   BILITOT 0.6 12/31/2017   Lab Results  Component Value Date   CHOL 228 (H) 12/31/2017   HDL 80.60 12/31/2017   LDLCALC 135 (H) 12/31/2017   LDLDIRECT 161.2 12/29/2007   TRIG 64.0 12/31/2017   CHOLHDL 3 12/31/2017    Lab Results  Component Value Date   HGBA1C 5.7 01/03/2018    Assessment & Plan    1. Frequent PVCs/palpitations: Outpatient cardiac monitor in December 2019 revealed frequent PVCs, intermittent bigeminy, trigeminy, no other significant arrhythmias.  He denies any recent palpitations.  Continue metoprolol.  2. Grade 2 diastolic dysfunction: Echo in 2683 showed EF 55 to 60%, no RWMA, grade 2 DD, no significant valvular abnormalities. CT cardiac scoring in 2020 showed coronary calcium 446, 41st percentile for age and sex matched control, calcium noted isolated to proximal LAD.   Euvolemic and well compensated on exam, denies symptoms concerning for angina. Continue metoprolol, losartan, spironolactone.  3. Hypertension: BP well controlled. Continue current antihypertensive regimen.   4. Hyperlipidemia: LDL was 44 in May 2022.  Monitored and managed per PCP.  Continue Crestor.  5. Disposition: Follow-up in 1 year.   Juan Grapes, NP 01/02/2022, 3:31 PM

## 2022-01-18 ENCOUNTER — Other Ambulatory Visit: Payer: Self-pay | Admitting: Cardiovascular Disease

## 2022-02-04 ENCOUNTER — Other Ambulatory Visit: Payer: Self-pay | Admitting: Internal Medicine

## 2022-03-11 ENCOUNTER — Other Ambulatory Visit: Payer: Self-pay | Admitting: Internal Medicine

## 2022-04-10 ENCOUNTER — Encounter: Payer: 59 | Admitting: Internal Medicine

## 2022-04-17 ENCOUNTER — Encounter: Payer: 59 | Admitting: Internal Medicine

## 2022-04-29 ENCOUNTER — Encounter: Payer: Self-pay | Admitting: Internal Medicine

## 2022-04-29 NOTE — Patient Instructions (Addendum)
Blood work down stairs.    Medications changes include :   none   Your prescription(s) have been sent to your pharmacy.     Return in about 1 year (around 05/01/2023) for Physical Exam.      Health Maintenance, Male Adopting a healthy lifestyle and getting preventive care are important in promoting health and wellness. Ask your health care provider about: The right schedule for you to have regular tests and exams. Things you can do on your own to prevent diseases and keep yourself healthy. What should I know about diet, weight, and exercise? Eat a healthy diet  Eat a diet that includes plenty of vegetables, fruits, low-fat dairy products, and lean protein. Do not eat a lot of foods that are high in solid fats, added sugars, or sodium. Maintain a healthy weight Body mass index (BMI) is a measurement that can be used to identify possible weight problems. It estimates body fat based on height and weight. Your health care provider can help determine your BMI and help you achieve or maintain a healthy weight. Get regular exercise Get regular exercise. This is one of the most important things you can do for your health. Most adults should: Exercise for at least 150 minutes each week. The exercise should increase your heart rate and make you sweat (moderate-intensity exercise). Do strengthening exercises at least twice a week. This is in addition to the moderate-intensity exercise. Spend less time sitting. Even light physical activity can be beneficial. Watch cholesterol and blood lipids Have your blood tested for lipids and cholesterol at 69 years of age, then have this test every 5 years. You may need to have your cholesterol levels checked more often if: Your lipid or cholesterol levels are high. You are older than 69 years of age. You are at high risk for heart disease. What should I know about cancer screening? Many types of cancers can be detected early and may often be  prevented. Depending on your health history and family history, you may need to have cancer screening at various ages. This may include screening for: Colorectal cancer. Prostate cancer. Skin cancer. Lung cancer. What should I know about heart disease, diabetes, and high blood pressure? Blood pressure and heart disease High blood pressure causes heart disease and increases the risk of stroke. This is more likely to develop in people who have high blood pressure readings or are overweight. Talk with your health care provider about your target blood pressure readings. Have your blood pressure checked: Every 3-5 years if you are 58-60 years of age. Every year if you are 54 years old or older. If you are between the ages of 48 and 75 and are a current or former smoker, ask your health care provider if you should have a one-time screening for abdominal aortic aneurysm (AAA). Diabetes Have regular diabetes screenings. This checks your fasting blood sugar level. Have the screening done: Once every three years after age 21 if you are at a normal weight and have a low risk for diabetes. More often and at a younger age if you are overweight or have a high risk for diabetes. What should I know about preventing infection? Hepatitis B If you have a higher risk for hepatitis B, you should be screened for this virus. Talk with your health care provider to find out if you are at risk for hepatitis B infection. Hepatitis C Blood testing is recommended for: Everyone born from [birth year] through 1963. Anyone with known  risk factors for hepatitis C. Sexually transmitted infections (STIs) You should be screened each year for STIs, including gonorrhea and chlamydia, if: You are sexually active and are younger than 69 years of age. You are older than 69 years of age and your health care provider tells you that you are at risk for this type of infection. Your sexual activity has changed since you were last screened,  and you are at increased risk for chlamydia or gonorrhea. Ask your health care provider if you are at risk. Ask your health care provider about whether you are at high risk for HIV. Your health care provider may recommend a prescription medicine to help prevent HIV infection. If you choose to take medicine to prevent HIV, you should first get tested for HIV. You should then be tested every 3 months for as long as you are taking the medicine. Follow these instructions at home: Alcohol use Do not drink alcohol if your health care provider tells you not to drink. If you drink alcohol: Limit how much you have to 0-2 drinks a day. Know how much alcohol is in your drink. In the [place] one drink equals one 12 oz bottle of beer (355 mL), one 5 oz glass of wine (148 mL), or one 1 oz glass of hard liquor (44 mL). Lifestyle Do not use any products that contain nicotine or tobacco. These products include cigarettes, chewing tobacco, and vaping devices, such as e-cigarettes. If you need help quitting, ask your health care provider. Do not use street drugs. Do not share needles. Ask your health care provider for help if you need support or information about quitting drugs. General instructions Schedule regular health, dental, and eye exams. Stay current with your vaccines. Tell your health care provider if: You often feel depressed. You have ever been abused or do not feel safe at home. Summary Adopting a healthy lifestyle and getting preventive care are important in promoting health and wellness. Follow your health care provider's instructions about healthy diet, exercising, and getting tested or screened for diseases. Follow your health care provider's instructions on monitoring your cholesterol and blood pressure. This information is not intended to replace advice given to you by your health care provider. Make sure you discuss any questions you have with your health care provider. Document Revised:  12/09/2020 Document Reviewed: 12/09/2020 Elsevier Patient Education  [place].

## 2022-04-29 NOTE — Progress Notes (Unsigned)
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   MRN: [id number]     HPI [name] is here for a physical exam.  He denies any major updates in his history.  He would like to try something different for sleep-no longer taking the Ambien.    Medications and allergies reviewed with patient and updated if appropriate.  Current Outpatient Medications on File Prior to Visit  Medication Sig Dispense Refill   metoprolol tartrate (LOPRESSOR) 50 MG tablet TAKE 1 & 1/2 TABLETS (75 MG TOTAL) BY MOUTH 2 TIMES DAILY. 270 tablet 2   rosuvastatin (CRESTOR) 40 MG tablet TAKE 1 TABLET BY MOUTH EVERY DAY 90 tablet 2   spironolactone (ALDACTONE) 25 MG tablet TAKE 1/2 TABLET BY MOUTH EVERY DAY 45 tablet 2   No current facility-administered medications on file prior to visit.    Review of Systems  Constitutional:  Negative for fever.  Eyes:  Negative for visual disturbance.  Respiratory:  Negative for cough, shortness of breath and wheezing.   Cardiovascular:  Negative for chest pain, palpitations and leg swelling.  Gastrointestinal:  Negative for abdominal pain, blood in stool, constipation, diarrhea and nausea.       No gerd  Genitourinary:  Negative for difficulty urinating, dysuria and hematuria.  Musculoskeletal:  Negative for arthralgias and back pain.  Skin:  Negative for rash.  Neurological:  Negative for light-headedness and headaches.  Psychiatric/Behavioral:  Negative for dysphoric mood. The patient is not nervous/anxious.        Objective:   Vitals:   04/30/22 0836  BP: 120/70  Pulse: 60  Temp: 98.1 F (36.7 C)  SpO2: 97%   Filed Weights   04/30/22 0836  Weight: 232 lb (105.2 kg)   Body mass index is 33.29 kg/m.  BP Readings from Last 3 Encounters:  04/30/22 120/70  01/02/22 122/72  04/04/21 132/72    Wt Readings from Last 3 Encounters:  04/30/22 232 lb (105.2 kg)  01/02/22 229 lb 3.2 oz (104 kg)  04/04/21 243 lb (110.2 kg)       Physical Exam Constitutional: He appears well-developed and well-nourished. No distress.  HENT:  Head: Normocephalic and atraumatic.  Right Ear: External ear normal.  Left Ear: External ear normal.  Mouth/Throat: Oropharynx is clear and moist.  Normal ear canals and TM b/l  Eyes: Conjunctivae and EOM are normal.  Neck: Neck supple. No tracheal deviation present. No thyromegaly present.  No carotid bruit  Cardiovascular: Normal rate, regular rhythm, normal heart sounds and intact distal pulses.   No murmur heard. Pulmonary/Chest: Effort normal and breath sounds normal. No respiratory distress. He has no wheezes. He has no rales.  Abdominal: Soft. He exhibits no distension. There is no tenderness.  Genitourinary: deferred  Musculoskeletal: He exhibits no edema.  Lymphadenopathy:   He has no cervical adenopathy.  Skin: Skin is warm and dry. He is not diaphoretic.  Psychiatric: He has a normal mood and affect. His behavior is normal.         Assessment & Plan:   Physical exam: Screening blood work-ordered Exercise   not currently due to an injury-but typically Weight encouraged weight loss Substance abuse   none   Reviewed recommended immunizations.   Health Maintenance  Topic Date Due   COVID-19 Vaccine (5 - Pfizer series) 05/16/2022 (Originally 08/31/2021)   Zoster Vaccines- Shingrix (1 of 2) 07/30/2022 (Originally 08/29/2002)   INFLUENZA VACCINE  11/01/2022 (Originally  03/03/2022)   TETANUS/TDAP  05/01/2023 (Originally 08/03/2021)   COLONOSCOPY (Pts 45-69yrs Insurance coverage will need to be confirmed)  10/19/2023   Pneumonia Vaccine 63+ Years old  Completed   Hepatitis C Screening  Completed   HPV VACCINES  Aged Out     See Problem List for Assessment and Plan of chronic medical problems.

## 2022-04-30 ENCOUNTER — Ambulatory Visit (INDEPENDENT_AMBULATORY_CARE_PROVIDER_SITE_OTHER): Payer: 59 | Admitting: Internal Medicine

## 2022-04-30 VITALS — BP 120/70 | HR 60 | Temp 98.1°F | Ht 70.0 in | Wt 232.0 lb

## 2022-04-30 DIAGNOSIS — I5189 Other ill-defined heart diseases: Secondary | ICD-10-CM

## 2022-04-30 DIAGNOSIS — F419 Anxiety disorder, unspecified: Secondary | ICD-10-CM

## 2022-04-30 DIAGNOSIS — E782 Mixed hyperlipidemia: Secondary | ICD-10-CM | POA: Diagnosis not present

## 2022-04-30 DIAGNOSIS — Z0000 Encounter for general adult medical examination without abnormal findings: Secondary | ICD-10-CM

## 2022-04-30 DIAGNOSIS — G4709 Other insomnia: Secondary | ICD-10-CM

## 2022-04-30 DIAGNOSIS — Z125 Encounter for screening for malignant neoplasm of prostate: Secondary | ICD-10-CM

## 2022-04-30 DIAGNOSIS — I493 Ventricular premature depolarization: Secondary | ICD-10-CM

## 2022-04-30 DIAGNOSIS — I10 Essential (primary) hypertension: Secondary | ICD-10-CM

## 2022-04-30 DIAGNOSIS — E559 Vitamin D deficiency, unspecified: Secondary | ICD-10-CM | POA: Diagnosis not present

## 2022-04-30 DIAGNOSIS — Z8546 Personal history of malignant neoplasm of prostate: Secondary | ICD-10-CM

## 2022-04-30 LAB — COMPREHENSIVE METABOLIC PANEL
ALT: 23 U/L (ref 0–53)
AST: 25 U/L (ref 0–37)
Albumin: 4.6 g/dL (ref 3.5–5.2)
Alkaline Phosphatase: 55 U/L (ref 39–117)
BUN: 23 mg/dL (ref 6–23)
CO2: 25 mEq/L (ref 19–32)
Calcium: 9.3 mg/dL (ref 8.4–10.5)
Chloride: 104 mEq/L (ref 96–112)
Creatinine, Ser: 1.03 mg/dL (ref 0.40–1.50)
GFR: 74.03 mL/min (ref >60.00)
Glucose, Bld: 118 mg/dL — ABNORMAL HIGH (ref 70–99)
Potassium: 4.6 mEq/L (ref 3.5–5.1)
Sodium: 136 mEq/L (ref 135–145)
Total Bilirubin: 0.5 mg/dL (ref 0.2–1.2)
Total Protein: 7.3 g/dL (ref 6.0–8.3)

## 2022-04-30 LAB — CBC WITH DIFFERENTIAL/PLATELET
Basophils Absolute: 0.1 10*3/uL (ref 0.0–0.1)
Basophils Relative: 1 % (ref 0.0–3.0)
Eosinophils Absolute: 0.3 10*3/uL (ref 0.0–0.7)
Eosinophils Relative: 3.8 % (ref 0.0–5.0)
HCT: 39.5 % (ref 39.0–52.0)
Hemoglobin: 13.3 g/dL (ref 13.0–17.0)
Lymphocytes Relative: 27.2 % (ref 12.0–46.0)
Lymphs Abs: 2 10*3/uL (ref 0.7–4.0)
MCHC: 33.6 g/dL (ref 30.0–36.0)
MCV: 90.1 fl (ref 78.0–100.0)
Monocytes Absolute: 0.6 10*3/uL (ref 0.1–1.0)
Monocytes Relative: 8.5 % (ref 3.0–12.0)
Neutro Abs: 4.3 10*3/uL (ref 1.4–7.7)
Neutrophils Relative %: 59.5 % (ref 43.0–77.0)
Platelets: 236 10*3/uL (ref 150.0–400.0)
RBC: 4.39 Mil/uL (ref 4.22–5.81)
RDW: 13.9 % (ref 11.5–15.5)
WBC: 7.2 10*3/uL (ref 4.0–10.5)

## 2022-04-30 LAB — LIPID PANEL
Cholesterol: 142 mg/dL (ref 0–200)
HDL: 71.7 mg/dL (ref >39.00)
LDL Cholesterol: 60 mg/dL (ref 0–99)
NonHDL: 70.57
Total CHOL/HDL Ratio: 2
Triglycerides: 52 mg/dL (ref 0.0–149.0)
VLDL: 10.4 mg/dL (ref 0.0–40.0)

## 2022-04-30 LAB — TSH: TSH: 0.62 u[IU]/mL (ref 0.35–5.50)

## 2022-04-30 LAB — PSA: PSA: 0 ng/mL — ABNORMAL LOW (ref 0.10–4.00)

## 2022-04-30 LAB — VITAMIN D 25 HYDROXY (VIT D DEFICIENCY, FRACTURES): VITD: 26.43 ng/mL — ABNORMAL LOW (ref 30.00–100.00)

## 2022-04-30 MED ORDER — TRAZODONE HCL 50 MG PO TABS: 50.0000 mg | ORAL_TABLET | Freq: Every day | ORAL | 3 refills | Status: DC

## 2022-04-30 MED ORDER — LOSARTAN POTASSIUM 100 MG PO TABS: 100.0000 mg | ORAL_TABLET | Freq: Every day | ORAL | 3 refills | Status: DC

## 2022-04-30 MED ORDER — AMLODIPINE BESYLATE 10 MG PO TABS: 10.0000 mg | ORAL_TABLET | Freq: Every day | ORAL | 3 refills | Status: DC

## 2022-04-30 MED ORDER — ZOLPIDEM TARTRATE 10 MG PO TABS: ORAL_TABLET | ORAL | 5 refills | Status: DC

## 2022-04-30 MED ORDER — SERTRALINE HCL 50 MG PO TABS
50.0000 mg | ORAL_TABLET | Freq: Every day | ORAL | 3 refills | Status: DC
Start: 2022-04-30 — End: 2023-06-30

## 2022-04-30 NOTE — Assessment & Plan Note (Signed)
Chronic Following with cardiology PVCs controlled with metoprolol 75 mg twice daily

## 2022-04-30 NOTE — Assessment & Plan Note (Signed)
Chronic , Cardiology On amlodipine 10 mg daily, metoprolol 75 mg twice daily, spironolactone 12.5 mg daily, losartan 100 mg daily

## 2022-04-30 NOTE — Assessment & Plan Note (Signed)
History of prostate cancer No longer seeing urology Check PSA

## 2022-04-30 NOTE — Assessment & Plan Note (Signed)
Chronic Blood pressure well controlled CMP Continue amlodipine 10 mg daily, losartan 100 mg daily, metoprolol 75 mg twice daily, spironolactone 12.5 mg daily

## 2022-04-30 NOTE — Assessment & Plan Note (Addendum)
Chronic No longer taking Ambien Would like to try trazodone-sleep is still an issue Trazodone 50-100 mg at bedtime

## 2022-04-30 NOTE — Assessment & Plan Note (Signed)
Chronic Controlled, Stable Continue sertraline 50 mg daily 

## 2022-04-30 NOTE — Assessment & Plan Note (Signed)
Chronic Taking vitamin D daily Check vitamin D level  

## 2022-04-30 NOTE — Assessment & Plan Note (Signed)
Chronic Regular exercise and healthy diet encouraged Check lipid panel  Continue rosuvastatin 40 mg daily 

## 2022-05-16 ENCOUNTER — Other Ambulatory Visit: Payer: Self-pay | Admitting: Cardiovascular Disease

## 2022-08-14 ENCOUNTER — Other Ambulatory Visit: Payer: Self-pay | Admitting: Cardiovascular Disease

## 2022-11-18 ENCOUNTER — Other Ambulatory Visit: Payer: Self-pay | Admitting: Cardiovascular Disease

## 2023-01-07 ENCOUNTER — Ambulatory Visit: Payer: 59 | Attending: Nurse Practitioner | Admitting: Nurse Practitioner

## 2023-01-07 ENCOUNTER — Encounter: Payer: Self-pay | Admitting: Nurse Practitioner

## 2023-01-07 VITALS — BP 130/70 | HR 65 | Ht 70.0 in | Wt 238.4 lb

## 2023-01-07 DIAGNOSIS — I5189 Other ill-defined heart diseases: Secondary | ICD-10-CM

## 2023-01-07 DIAGNOSIS — I10 Essential (primary) hypertension: Secondary | ICD-10-CM

## 2023-01-07 DIAGNOSIS — E785 Hyperlipidemia, unspecified: Secondary | ICD-10-CM | POA: Diagnosis not present

## 2023-01-07 DIAGNOSIS — I493 Ventricular premature depolarization: Secondary | ICD-10-CM | POA: Diagnosis not present

## 2023-01-07 NOTE — Progress Notes (Addendum)
Office Visit    Patient Name: Juan Balding, MD Date of Encounter: 01/07/2023  Primary Care Provider:  Pincus Sanes, MD Primary Cardiologist:  Nicki Guadalajara, MD  Chief Complaint    72 year old male with a history of hypertension, palpitations, PVCs, grade 2 diastolic dysfunction, and hyperlipidemia who presents for follow-up related to hypertension and palpitations.   Past Medical History    Past Medical History:  Diagnosis Date   Hyperlipidemia    Hypertension    Past Surgical History:  Procedure Laterality Date   APPENDECTOMY     COLONOSCOPY  2003 & 2015   Dr Matthias Hughs   LAPAROSCOPIC RETROPUBIC PROSTATECTOMY  2008   Dr Laverle Patter   UPPER GI ENDOSCOPY  2015   twice    Allergies  No Active Allergies   Labs/Other Studies Reviewed    The following studies were reviewed today:  Cardiac Studies & Procedures       ECHOCARDIOGRAM  ECHOCARDIOGRAM COMPLETE 07/18/2018  Narrative *[facility]* [address] [place], [place] [address] [phone]  ------------------------------------------------------------------- Transthoracic Echocardiography  Patient:    Juan Coleman, Juan Coleman MR #:       [id number] Study Date: 07/18/2018 Gender:     M Age:        67 Height:     177.8 cm Weight:     103 kg BSA:        2.29 m^2 Pt. Status: Room:  ATTENDING    Nicki Guadalajara, M.D. ORDERING     Nicki Guadalajara, M.D. REFERRING    Nicki Guadalajara, M.D. SONOGRAPHER  Aida Raider, RDCS PERFORMING   [facility] Outpatient  cc:  ------------------------------------------------------------------- LV EF: 55% -   60%  ------------------------------------------------------------------- Indications:      R00.2 Palpitations.  I49.3 PVC&'s.  ------------------------------------------------------------------- History:   PMH:  Acquired from the patient and from the patient&'s chart.  PMH:  Cardiac Dysrhythmia. Anxiety.  Risk factors: Hypertension.  Dyslipidemia.  ------------------------------------------------------------------- Study Conclusions  - Left ventricle: The cavity size was normal. Systolic function was normal. The estimated ejection fraction was in the range of 55% to 60%. Wall motion was normal; there were no regional wall motion abnormalities. Features are consistent with a pseudonormal left ventricular filling pattern, with concomitant abnormal relaxation and increased filling pressure (grade 2 diastolic dysfunction). Doppler parameters are consistent with elevated ventricular end-diastolic filling pressure. - Aortic valve: There was trivial regurgitation. - Mitral valve: There was mild regurgitation. - Left atrium: The atrium was mildly dilated. - Right ventricle: Systolic function was normal. - Right atrium: The atrium was normal in size. - Pulmonary arteries: Systolic pressure was within the normal range. - Inferior vena cava: The vessel was normal in size. - Pericardium, extracardiac: There was no pericardial effusion.  ------------------------------------------------------------------- Study data:   Study status:  Routine.  Procedure:  The patient reported no pain pre or post test. Transthoracic echocardiography for left ventricular function evaluation, for right ventricular function evaluation, and for assessment of valvular function. Image quality was adequate.  Study completion:  There were no complications.          Transthoracic echocardiography.  M-mode, complete 2D, spectral Doppler, and color Doppler.  Birthdate: Patient birthdate: 20-Apr-1951.  Age:  Patient is 72 yr old.  Sex: Gender: male.    BMI: 32.6 kg/m^2.  Patient status:  Outpatient. Study date:  Study date: 07/18/2018. Study time: 03:59 PM. Location:  [facility]  -------------------------------------------------------------------  ------------------------------------------------------------------- Left ventricle:  The cavity  size was normal. Systolic  function was normal. The estimated ejection fraction was in the range of 55% to 60%. Wall motion was normal; there were no regional wall motion abnormalities. Features are consistent with a pseudonormal left ventricular filling pattern, with concomitant abnormal relaxation and increased filling pressure (grade 2 diastolic dysfunction). Doppler parameters are consistent with elevated ventricular end-diastolic filling pressure.  ------------------------------------------------------------------- Aortic valve:   Trileaflet; normal thickness leaflets. Mobility was not restricted. Sclerosis without stenosis.  Doppler: Transvalvular velocity was within the normal range. There was no stenosis. There was trivial regurgitation.  ------------------------------------------------------------------- Aorta:  Aortic root: The aortic root was normal in size.  ------------------------------------------------------------------- Mitral valve:   Structurally normal valve.   Mobility was not restricted.  Doppler:  Transvalvular velocity was within the normal range. There was no evidence for stenosis. There was mild regurgitation.    Peak gradient (D): 5 mm Hg.  ------------------------------------------------------------------- Left atrium:  The atrium was mildly dilated.  ------------------------------------------------------------------- Right ventricle:  The cavity size was normal. Wall thickness was normal. Systolic function was normal.  ------------------------------------------------------------------- Pulmonic valve:    Structurally normal valve.   Cusp separation was normal.  Doppler:  Transvalvular velocity was within the normal range. There was no evidence for stenosis. There was no regurgitation.  ------------------------------------------------------------------- Tricuspid valve:   Structurally normal valve.    Doppler: Transvalvular velocity was within the  normal range. There was no regurgitation.  ------------------------------------------------------------------- Pulmonary artery:   The main pulmonary artery was normal-sized. Systolic pressure was within the normal range.  ------------------------------------------------------------------- Right atrium:  The atrium was normal in size.  ------------------------------------------------------------------- Pericardium:  There was no pericardial effusion.  ------------------------------------------------------------------- Systemic veins: Inferior vena cava: The vessel was normal in size.  ------------------------------------------------------------------- Measurements  Left ventricle                          Value        Reference LV ID, ED, PLAX chordal                 47    mm     43 - 52 LV ID, ES, PLAX chordal                 29    mm     23 - 38 LV fx shortening, PLAX chordal          38    %      >=29 LV PW thickness, ED                     12    mm     ---------- IVS/LV PW ratio, ED                     0.67         <=1.3 Stroke volume, 2D                       87    ml     ---------- Stroke volume/bsa, 2D                   38    ml/m^2 ---------- LV e&', lateral                          15    cm/s   ---------- LV E/e&', lateral  7.47         ---------- LV e&', medial                           7.62  cm/s   ---------- LV E/e&', medial                         14.7         ---------- LV e&', average                          11.31 cm/s   ---------- LV E/e&', average                        9.9          ----------  Ventricular septum                      Value        Reference IVS thickness, ED                       8     mm     ----------  LVOT                                    Value        Reference LVOT ID, S                              21    mm     ---------- LVOT area                               3.46  cm^2   ---------- LVOT ID                                  21    mm     ---------- LVOT peak velocity, S                   116   cm/s   ---------- LVOT mean velocity, S                   80.4  cm/s   ---------- LVOT VTI, S                             25.2  cm     ---------- LVOT peak gradient, S                   5     mm Hg  ---------- Stroke volume (SV), LVOT DP             87.3  ml     ---------- Stroke index (SV/bsa), LVOT DP          38.2  ml/m^2 ----------  Aorta                                   Value  Reference Aortic root ID, ED                      35    mm     ---------- Ascending aorta ID, A-P, S              37    mm     ----------  Left atrium                             Value        Reference LA ID, A-P, ES                          46    mm     ---------- LA ID/bsa, A-P                          2.01  cm/m^2 <=2.2 LA volume, S                            65.4  ml     ---------- LA volume/bsa, S                        28.6  ml/m^2 ---------- LA volume, ES, 1-p A4C                  53.2  ml     ---------- LA volume/bsa, ES, 1-p A4C              23.3  ml/m^2 ---------- LA volume, ES, 1-p A2C                  71.8  ml     ---------- LA volume/bsa, ES, 1-p A2C              31.4  ml/m^2 ----------  Mitral valve                            Value        Reference Mitral E-wave peak velocity             112   cm/s   ---------- Mitral A-wave peak velocity             108   cm/s   ---------- Mitral deceleration time                187   ms     150 - 230 Mitral peak gradient, D                 5     mm Hg  ---------- Mitral E/A ratio, peak                  1            ----------  Right atrium                            Value        Reference RA ID, S-I, ES, A4C                     40.8  mm     34 - 49 RA area,  ES, A4C                        12.4  cm^2   8.3 - 19.5 RA volume, ES, A/L                      31.4  ml     ---------- RA volume/bsa, ES, A/L                  13.7  ml/m^2 ----------  Right ventricle                          Value        Reference RV ID, minor axis, ED, A4C base         36    mm     ---------- TAPSE                                   29.3  mm     ---------- RV s&', lateral, S                       21.4  cm/s   ----------  Legend: (L)  and  (H)  mark values outside specified reference range.  ------------------------------------------------------------------- Prepared and Electronically Authenticated by  Tobias Alexander, M.D. 2019-12-16T16:52:24    MONITORS  CARDIAC EVENT MONITOR 08/08/2018  Narrative Dr. Arelia Sneddon was monitored on July 21, 2018 through August 03, 2018.  Average heart rate was proximately 75 bpm.  Throughout the monitoring period, he had frequent isolated PVCs had both bigeminy with up to 16 PVCs and 1 minute on another occasion 28 PVCs and 1 minute and also had episodes of trigeminal rhythm with his episodes of 22, 24, and 25 occurring in 1 minute.  He did not have any ventricular couplets.  There were no episodes of nonsustained ventricular tachycardia.  All PVCs were isolated.  He did not have any significant pauses.  His maximal heart rate was sinus tachycardia at 139 bpm which occurred at 5:24 PM on August 01, 2018 and sinus bradycardia at 50 bpm which occurred on December 29 at 7:44 AM.   CT SCANS  CT CARDIAC SCORING (SELF PAY ONLY) 09/19/2018  Addendum 09/19/2018  6:45 PM ADDENDUM REPORT: 09/19/2018 18:43  CLINICAL DATA:  Risk stratification  EXAM: Coronary Calcium Score  TECHNIQUE: The patient was scanned on a Siemens Somatom 64 slice scanner. Axial non-contrast 3 mm slices were carried out through the heart. The data set was analyzed on a dedicated work station and scored using the Agatson method.  FINDINGS: Non-cardiac: See separate report from [facility].  Ascending aorta: Mildly dilated 3.8 cm  Pericardium: Normal  Coronary arteries: Calcium noted isolated to proximal LAD  IMPRESSION: Coronary calcium score of 46. This was 66 st  percentile for age and sex matched control.  Charlton Haws   Electronically Signed By: Charlton Haws M.D. On: 09/19/2018 18:43  Narrative EXAM: OVER-READ INTERPRETATION  CT CHEST  The following report is an over-read performed by radiologist Dr. Trudie Reed of [facility] on 09/15/2018. This over-read does not include interpretation of cardiac or coronary anatomy or pathology. The coronary calcium score interpretation by the cardiologist is attached.  COMPARISON:  None.  FINDINGS: Small calcified granuloma in the medial aspect of the right upper lobe.  Within the visualized portions of the thorax there are no other suspicious appearing pulmonary nodules or masses, there is no acute consolidative airspace disease, no pleural effusions, no pneumothorax and no lymphadenopathy. Several densely calcified right hilar lymph nodes are incidentally noted. Visualized portions of the upper abdomen are unremarkable. There are no aggressive appearing lytic or blastic lesions noted in the visualized portions of the skeleton.  IMPRESSION: 1. No significant incidental noncardiac findings are noted.  Electronically Signed: By: Trudie Reed M.D. On: 09/15/2018 16:35         Recent Labs: 04/30/2022: ALT 23; BUN 23; Creatinine, Ser 1.03; Hemoglobin 13.3; Platelets 236.0; Potassium 4.6; Sodium 136; TSH 0.62  Recent Lipid Panel    Component Value Date/Time   CHOL 142 04/30/2022 0905   TRIG 52.0 04/30/2022 0905   HDL 71.70 04/30/2022 0905   CHOLHDL 2 04/30/2022 0905   VLDL 10.4 04/30/2022 0905   LDLCALC 60 04/30/2022 0905   LDLDIRECT 161.2 12/29/2007 0000    History of Present Illness    72 year old male with the above past medical history including hypertension, palpitations, PVCs, grade 2 diastolic dysfunction, and hyperlipidemia.   He was initially evaluated by Dr. Tresa Endo in 2019 in the setting of abnormal EKG which showed ventricular bigeminy and evidence for  VA conduction with retrograde P waves post PVC. Outpatient cardiac monitor in December 2019 revealed frequent PVCs, intermittent bigeminy, trigeminy, no other significant arrhythmias.  Echocardiogram at the time showed EF 55 to 60%, no RWMA, grade 2 DD, no significant valvular abnormalities. CT cardiac scoring in 2020 showed coronary calcium 446, 41st percentile for age and sex matched control, calcium noted isolated to proximal LAD.  Ectopy improved with metoprolol. He was last seen in the office on 01/02/2022 and was doing well from a cardiac standpoint.  He denies any significant palpitations.  BP was well-controlled.   He presents today for follow-up. Since his last visit he has done well from a cardiac standpoint. 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  He denies any palpitations, dyspnea, edema, PND, orthopnea, weight gain.  BP has been well-controlled.  Overall, he reports feeling well.  Home Medications     Current Outpatient Medications  Medication Sig Dispense Refill   albuterol (VENTOLIN HFA) 108 (90 Base) MCG/ACT inhaler Inhale 2 puffs into the lungs every 4 (four) hours.     amLODipine (NORVASC) 10 MG tablet Take 1 tablet (10 mg total) by mouth daily. 90 tablet 3   losartan (COZAAR) 100 MG tablet Take 1 tablet (100 mg total) by mouth daily. 90 tablet 3   metoprolol tartrate (LOPRESSOR) 50 MG tablet TAKE 1 & 1/2 TABLETS (75 MG TOTAL) BY MOUTH 2 TIMES DAILY. 270 tablet 2   mirabegron ER (MYRBETRIQ) 25 MG TB24 tablet Take 25 mg by mouth daily.     rosuvastatin (CRESTOR) 40 MG tablet TAKE 1 TABLET BY MOUTH EVERY DAY 90 tablet 2   sertraline (ZOLOFT) 50 MG tablet Take 1 tablet (50 mg total) by mouth daily. 90 tablet 3   spironolactone (ALDACTONE) 25 MG tablet TAKE 1/2 TABLET BY MOUTH EVERY DAY 45 tablet 3   traZODone (DESYREL) 50 MG tablet Take 1-2 tablets (50-100 mg total) by mouth at bedtime. 180 tablet 3   No current facility-administered medications for this visit.     Review of  Systems    He denies chest pain, palpitations, dyspnea, pnd, orthopnea, n, v, dizziness, syncope, edema, weight gain, or early satiety. All other systems reviewed  and are otherwise negative except as noted above.   Physical Exam    VS:  BP 130/70 (BP Location: Right Arm, Patient Position: Sitting, Cuff Size: Normal)   Pulse 65   Ht 5\' 10"  (1.778 m)   Wt 238 lb 6.4 oz (108.1 kg)   SpO2 93%   BMI 34.21 kg/m  GEN: Well nourished, well developed, in no acute distress. HEENT: normal. Neck: Supple, no JVD, carotid bruits, or masses. Cardiac: RRR, no murmurs, rubs, or gallops. No clubbing, cyanosis, edema.  Radials/DP/PT 2+ and equal bilaterally.  Respiratory:  Respirations regular and unlabored, clear to auscultation bilaterally. GI: Soft, nontender, nondistended, BS + x 4. MS: no deformity or atrophy. Skin: warm and dry, no rash. Neuro:  Strength and sensation are intact. Psych: Normal affect.  Accessory Clinical Findings    ECG personally reviewed by me today -NSR, 65 bpm- no acute changes.   Lab Results  Component Value Date   WBC 7.2 04/30/2022   HGB 13.3 04/30/2022   HCT 39.5 04/30/2022   MCV 90.1 04/30/2022   PLT 236.0 04/30/2022   Lab Results  Component Value Date   CREATININE 1.03 04/30/2022   BUN 23 04/30/2022   NA 136 04/30/2022   K 4.6 04/30/2022   CL 104 04/30/2022   CO2 25 04/30/2022   Lab Results  Component Value Date   ALT 23 04/30/2022   AST 25 04/30/2022   ALKPHOS 55 04/30/2022   BILITOT 0.5 04/30/2022   Lab Results  Component Value Date   CHOL 142 04/30/2022   HDL 71.70 04/30/2022   LDLCALC 60 04/30/2022   LDLDIRECT 161.2 12/29/2007   TRIG 52.0 04/30/2022   CHOLHDL 2 04/30/2022    Lab Results  Component Value Date   HGBA1C 5.7 01/03/2018    Assessment & Plan    1. Frequent PVCs/palpitations: Outpatient cardiac monitor in December 2019 revealed frequent PVCs, intermittent bigeminy, trigeminy, no other significant arrhythmias.  He denies  any recent palpitations.  Continue metoprolol.   2. Grade 2 diastolic dysfunction: Echo in 7829 showed EF 55 to 60%, no RWMA, grade 2 DD, no significant valvular abnormalities. CT cardiac scoring in 2020 showed coronary calcium 446, 41st percentile for age and sex matched control, calcium noted isolated to proximal LAD.   Euvolemic and well compensated on exam, denies symptoms concerning for angina. Continue metoprolol, losartan, spironolactone.   3. Hypertension: BP well controlled. Continue current antihypertensive regimen.    4. Hyperlipidemia: LDL was 60 in 04/2022.  Monitored and managed per PCP.  Continue Crestor.   5. Disposition: Follow-up in 1 year.      Joylene Grapes, NP 01/07/2023, 4:17 PM

## 2023-01-07 NOTE — Patient Instructions (Signed)
Medication Instructions:  Your physician recommends that you continue on your current medications as directed. Please refer to the Current Medication list given to you today.   *If you need a refill on your cardiac medications before your next appointment, please call your pharmacy*   Lab Work: NONE ordered at this time of appointment   If you have labs (blood work) drawn today and your tests are completely normal, you will receive your results only by: MyChart Message (if you have MyChart) OR A paper copy in the mail If you have any lab test that is abnormal or we need to change your treatment, we will call you to review the results.   Testing/Procedures: NONE ordered at this time of appointment     Follow-Up: At [facility], you and your health needs are our priority.  As part of our continuing mission to provide you with exceptional heart care, we have created designated Provider Care Teams.  These Care Teams include your primary Cardiologist (physician) and Advanced Practice Providers (APPs -  Physician Assistants and Nurse Practitioners) who all work together to provide you with the care you need, when you need it.  We recommend signing up for the patient portal called "MyChart".  Sign up information is provided on this After Visit Summary.  MyChart is used to connect with patients for Virtual Visits (Telemedicine).  Patients are able to view lab/test results, encounter notes, upcoming appointments, etc.  Non-urgent messages can be sent to your provider as well.   To learn more about what you can do with MyChart, go to [email or web address].    Your next appointment:   1 year(s)  Provider:   Thomas Kelly, MD     Other Instructions  

## 2023-01-08 NOTE — Addendum Note (Signed)
Addended by: Berlinda Last on: 01/08/2023 02:52 PM   Modules accepted: Orders

## 2023-01-27 ENCOUNTER — Other Ambulatory Visit: Payer: Self-pay | Admitting: Cardiovascular Disease

## 2023-02-22 DIAGNOSIS — M1712 Unilateral primary osteoarthritis, left knee: Secondary | ICD-10-CM | POA: Insufficient documentation

## 2023-03-22 ENCOUNTER — Other Ambulatory Visit: Payer: Self-pay | Admitting: Internal Medicine

## 2023-05-07 ENCOUNTER — Encounter: Payer: 59 | Admitting: Internal Medicine

## 2023-05-30 ENCOUNTER — Encounter: Payer: Self-pay | Admitting: Internal Medicine

## 2023-05-30 NOTE — Patient Instructions (Addendum)
Blood work was ordered.   The lab is on the first floor.    Medications changes include :   none     Return in about 1 year (around 06/03/2024) for Physical Exam.     Health Maintenance, Male Adopting a healthy lifestyle and getting preventive care are important in promoting health and wellness. Ask your health care provider about: The right schedule for you to have regular tests and exams. Things you can do on your own to prevent diseases and keep yourself healthy. What should I know about diet, weight, and exercise? Eat a healthy diet  Eat a diet that includes plenty of vegetables, fruits, low-fat dairy products, and lean protein. Do not eat a lot of foods that are high in solid fats, added sugars, or sodium. Maintain a healthy weight Body mass index (BMI) is a measurement that can be used to identify possible weight problems. It estimates body fat based on height and weight. Your health care provider can help determine your BMI and help you achieve or maintain a healthy weight. Get regular exercise Get regular exercise. This is one of the most important things you can do for your health. Most adults should: Exercise for at least 150 minutes each week. The exercise should increase your heart rate and make you sweat (moderate-intensity exercise). Do strengthening exercises at least twice a week. This is in addition to the moderate-intensity exercise. Spend less time sitting. Even light physical activity can be beneficial. Watch cholesterol and blood lipids Have your blood tested for lipids and cholesterol at 70 years of age, then have this test every 5 years. You may need to have your cholesterol levels checked more often if: Your lipid or cholesterol levels are high. You are older than 70 years of age. You are at high risk for heart disease. What should I know about cancer screening? Many types of cancers can be detected early and may often be prevented. Depending on  your health history and family history, you may need to have cancer screening at various ages. This may include screening for: Colorectal cancer. Prostate cancer. Skin cancer. Lung cancer. What should I know about heart disease, diabetes, and high blood pressure? Blood pressure and heart disease High blood pressure causes heart disease and increases the risk of stroke. This is more likely to develop in people who have high blood pressure readings or are overweight. Talk with your health care provider about your target blood pressure readings. Have your blood pressure checked: Every 3-5 years if you are 32-53 years of age. Every year if you are 81 years old or older. If you are between the ages of 58 and 55 and are a current or former smoker, ask your health care provider if you should have a one-time screening for abdominal aortic aneurysm (AAA). Diabetes Have regular diabetes screenings. This checks your fasting blood sugar level. Have the screening done: Once every three years after age 3 if you are at a normal weight and have a low risk for diabetes. More often and at a younger age if you are overweight or have a high risk for diabetes. What should I know about preventing infection? Hepatitis B If you have a higher risk for hepatitis B, you should be screened for this virus. Talk with your health care provider to find out if you are at risk for hepatitis B infection. Hepatitis C Blood testing is recommended for: Everyone born from [birth year] through 1963. Anyone with  known risk factors for hepatitis C. Sexually transmitted infections (STIs) You should be screened each year for STIs, including gonorrhea and chlamydia, if: You are sexually active and are younger than 70 years of age. You are older than 70 years of age and your health care provider tells you that you are at risk for this type of infection. Your sexual activity has changed since you were last screened, and you are at increased  risk for chlamydia or gonorrhea. Ask your health care provider if you are at risk. Ask your health care provider about whether you are at high risk for HIV. Your health care provider may recommend a prescription medicine to help prevent HIV infection. If you choose to take medicine to prevent HIV, you should first get tested for HIV. You should then be tested every 3 months for as long as you are taking the medicine. Follow these instructions at home: Alcohol use Do not drink alcohol if your health care provider tells you not to drink. If you drink alcohol: Limit how much you have to 0-2 drinks a day. Know how much alcohol is in your drink. In the [place] one drink equals one 12 oz bottle of beer (355 mL), one 5 oz glass of wine (148 mL), or one 1 oz glass of hard liquor (44 mL). Lifestyle Do not use any products that contain nicotine or tobacco. These products include cigarettes, chewing tobacco, and vaping devices, such as e-cigarettes. If you need help quitting, ask your health care provider. Do not use street drugs. Do not share needles. Ask your health care provider for help if you need support or information about quitting drugs. General instructions Schedule regular health, dental, and eye exams. Stay current with your vaccines. Tell your health care provider if: You often feel depressed. You have ever been abused or do not feel safe at home. Summary Adopting a healthy lifestyle and getting preventive care are important in promoting health and wellness. Follow your health care provider's instructions about healthy diet, exercising, and getting tested or screened for diseases. Follow your health care provider's instructions on monitoring your cholesterol and blood pressure. This information is not intended to replace advice given to you by your health care provider. Make sure you discuss any questions you have with your health care provider. Document Revised: 12/09/2020 Document  Reviewed: 12/09/2020 Elsevier Patient Education  2024 [employer].

## 2023-05-30 NOTE — Progress Notes (Signed)
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   MRN: [id number]     HPI Juan Coleman is here for a physical exam and his chronic medical problems.   Overall doing well.  He does knee a knee replacement.    Medications and allergies reviewed with patient and updated if appropriate.  Current Outpatient Medications on File Prior to Visit  Medication Sig Dispense Refill   albuterol (VENTOLIN HFA) 108 (90 Base) MCG/ACT inhaler Inhale 2 puffs into the lungs every 4 (four) hours.     amLODipine (NORVASC) 10 MG tablet Take 1 tablet (10 mg total) by mouth daily. 90 tablet 3   losartan (COZAAR) 100 MG tablet Take 1 tablet (100 mg total) by mouth daily. 90 tablet 3   metoprolol tartrate (LOPRESSOR) 50 MG tablet TAKE 1 & 1/2 TABLETS (75 MG TOTAL) BY MOUTH 2 TIMES DAILY. 270 tablet 2   mirabegron ER (MYRBETRIQ) 25 MG TB24 tablet Take 25 mg by mouth daily.     rosuvastatin (CRESTOR) 40 MG tablet TAKE 1 TABLET BY MOUTH EVERY DAY 90 tablet 2   sertraline (ZOLOFT) 50 MG tablet Take 1 tablet (50 mg total) by mouth daily. 90 tablet 3   spironolactone (ALDACTONE) 25 MG tablet TAKE 1/2 TABLET BY MOUTH EVERY DAY 45 tablet 3   traZODone (DESYREL) 50 MG tablet TAKE 1-2 TABLETS BY MOUTH AT BEDTIME. 180 tablet 3   No current facility-administered medications on file prior to visit.    Review of Systems  Constitutional:  Negative for fever.  Eyes:  Negative for visual disturbance.  Respiratory:  Negative for cough, shortness of breath and wheezing.   Cardiovascular:  Negative for chest pain, palpitations and leg swelling.  Gastrointestinal:  Negative for abdominal pain, blood in stool, constipation and diarrhea.       No gerd  Genitourinary:  Negative for difficulty urinating, dysuria and hematuria.  Musculoskeletal:  Positive for arthralgias (knee). Negative for back pain.  Skin:  Negative for rash.  Neurological:  Negative for light-headedness and headaches.   Psychiatric/Behavioral:  Negative for dysphoric mood. The patient is nervous/anxious.        Objective:   Vitals:   06/04/23 0900  BP: 130/74  Pulse: 79  Temp: 98.4 F (36.9 C)  SpO2: 95%   Filed Weights   06/04/23 0900  Weight: 234 lb (106.1 kg)   Body mass index is 33.58 kg/m.  BP Readings from Last 3 Encounters:  06/04/23 130/74  01/07/23 130/70  04/30/22 120/70    Wt Readings from Last 3 Encounters:  06/04/23 234 lb (106.1 kg)  01/07/23 238 lb 6.4 oz (108.1 kg)  04/30/22 232 lb (105.2 kg)      Physical Exam Constitutional: He appears well-developed and well-nourished. No distress.  HENT:  Head: Normocephalic and atraumatic.  Right Ear: External ear normal.  Left Ear: External ear normal.  Normal ear canals and TM b/l  Mouth/Throat: Oropharynx is clear and moist. Eyes: Conjunctivae and EOM are normal.  Neck: Neck supple. No tracheal deviation present. No thyromegaly present.  No carotid bruit  Cardiovascular: Normal rate, regular rhythm, normal heart sounds and intact distal pulses.   No murmur heard.  No lower extremity edema. Pulmonary/Chest: Effort normal and breath sounds normal. No respiratory distress. He has no wheezes. He has no rales.  Abdominal: Soft. He exhibits no distension. There is no tenderness.  Genitourinary: deferred  Lymphadenopathy:   He has no  cervical adenopathy.  Skin: Skin is warm and dry. He is not diaphoretic.  Psychiatric: He has a normal mood and affect. His behavior is normal.         Assessment & Plan:   Physical exam: Screening blood work  ordered Exercise  elliptical   Weight   - knows he should lose weight Substance abuse   none   Reviewed recommended immunizations.   Health Maintenance  Topic Date Due   COVID-19 Vaccine (6 - 2023-24 season) 06/20/2023 (Originally 04/04/2023)   Zoster Vaccines- Shingrix (1 of 2) 09/04/2023 (Originally 08/29/2002)   DTaP/Tdap/Td (2 - Td or Tdap) 06/03/2024 (Originally  08/03/2021)   Colonoscopy  10/19/2023   Pneumonia Vaccine 14+ Years old  Completed   INFLUENZA VACCINE  Completed   Hepatitis C Screening  Completed   HPV VACCINES  Aged Out     See Problem List for Assessment and Plan of chronic medical problems.

## 2023-06-04 ENCOUNTER — Ambulatory Visit (INDEPENDENT_AMBULATORY_CARE_PROVIDER_SITE_OTHER): Payer: 59 | Admitting: Internal Medicine

## 2023-06-04 VITALS — BP 130/74 | HR 79 | Temp 98.4°F | Ht 70.0 in | Wt 234.0 lb

## 2023-06-04 DIAGNOSIS — E782 Mixed hyperlipidemia: Secondary | ICD-10-CM

## 2023-06-04 DIAGNOSIS — E559 Vitamin D deficiency, unspecified: Secondary | ICD-10-CM | POA: Diagnosis not present

## 2023-06-04 DIAGNOSIS — Z125 Encounter for screening for malignant neoplasm of prostate: Secondary | ICD-10-CM

## 2023-06-04 DIAGNOSIS — Z0000 Encounter for general adult medical examination without abnormal findings: Secondary | ICD-10-CM

## 2023-06-04 DIAGNOSIS — I5189 Other ill-defined heart diseases: Secondary | ICD-10-CM

## 2023-06-04 DIAGNOSIS — I10 Essential (primary) hypertension: Secondary | ICD-10-CM | POA: Diagnosis not present

## 2023-06-04 DIAGNOSIS — R739 Hyperglycemia, unspecified: Secondary | ICD-10-CM | POA: Diagnosis not present

## 2023-06-04 DIAGNOSIS — G4709 Other insomnia: Secondary | ICD-10-CM

## 2023-06-04 DIAGNOSIS — R7303 Prediabetes: Secondary | ICD-10-CM | POA: Insufficient documentation

## 2023-06-04 DIAGNOSIS — F419 Anxiety disorder, unspecified: Secondary | ICD-10-CM

## 2023-06-04 LAB — LIPID PANEL
Cholesterol: 139 mg/dL (ref 0–200)
HDL: 77.3 mg/dL (ref >39.00)
LDL Cholesterol: 51 mg/dL (ref 0–99)
NonHDL: 61.6
Total CHOL/HDL Ratio: 2
Triglycerides: 55 mg/dL (ref 0.0–149.0)
VLDL: 11 mg/dL (ref 0.0–40.0)

## 2023-06-04 LAB — CBC WITH DIFFERENTIAL/PLATELET
Basophils Absolute: 0.1 10*3/uL (ref 0.0–0.1)
Basophils Relative: 1 % (ref 0.0–3.0)
Eosinophils Absolute: 0.2 10*3/uL (ref 0.0–0.7)
Eosinophils Relative: 2.7 % (ref 0.0–5.0)
HCT: 40 % (ref 39.0–52.0)
Hemoglobin: 12.9 g/dL — ABNORMAL LOW (ref 13.0–17.0)
Lymphocytes Relative: 21.9 % (ref 12.0–46.0)
Lymphs Abs: 1.7 10*3/uL (ref 0.7–4.0)
MCHC: 32.1 g/dL (ref 30.0–36.0)
MCV: 91.6 fL (ref 78.0–100.0)
Monocytes Absolute: 0.6 10*3/uL (ref 0.1–1.0)
Monocytes Relative: 7.7 % (ref 3.0–12.0)
Neutro Abs: 5.2 10*3/uL (ref 1.4–7.7)
Neutrophils Relative %: 66.7 % (ref 43.0–77.0)
Platelets: 270 10*3/uL (ref 150.0–400.0)
RBC: 4.37 Mil/uL (ref 4.22–5.81)
RDW: 14.5 % (ref 11.5–15.5)
WBC: 7.7 10*3/uL (ref 4.0–10.5)

## 2023-06-04 LAB — COMPREHENSIVE METABOLIC PANEL
ALT: 23 U/L (ref 0–53)
AST: 27 U/L (ref 0–37)
Albumin: 4.5 g/dL (ref 3.5–5.2)
Alkaline Phosphatase: 54 U/L (ref 39–117)
BUN: 22 mg/dL (ref 6–23)
CO2: 28 meq/L (ref 19–32)
Calcium: 9.6 mg/dL (ref 8.4–10.5)
Chloride: 105 meq/L (ref 96–112)
Creatinine, Ser: 1.04 mg/dL (ref 0.40–1.50)
GFR: 72.62 mL/min (ref >60.00)
Glucose, Bld: 108 mg/dL — ABNORMAL HIGH (ref 70–99)
Potassium: 4.9 meq/L (ref 3.5–5.1)
Sodium: 140 meq/L (ref 135–145)
Total Bilirubin: 0.6 mg/dL (ref 0.2–1.2)
Total Protein: 7.2 g/dL (ref 6.0–8.3)

## 2023-06-04 LAB — HEMOGLOBIN A1C: Hgb A1c MFr Bld: 6.1 % (ref 4.6–6.5)

## 2023-06-04 LAB — PSA: PSA: 0 ng/mL — ABNORMAL LOW (ref 0.10–4.00)

## 2023-06-04 LAB — VITAMIN D 25 HYDROXY (VIT D DEFICIENCY, FRACTURES): VITD: 23.7 ng/mL — ABNORMAL LOW (ref 30.00–100.00)

## 2023-06-04 LAB — TSH: TSH: 0.65 u[IU]/mL (ref 0.35–5.50)

## 2023-06-04 NOTE — Assessment & Plan Note (Signed)
Chronic No longer taking Ambien Trazodone 50-100 mg at bedtime

## 2023-06-04 NOTE — Assessment & Plan Note (Signed)
Chronic Controlled, Stable Continue sertraline 50 mg daily 

## 2023-06-04 NOTE — Assessment & Plan Note (Signed)
Chronic Check a1c Low sugar / carb diet Stressed regular exercise  

## 2023-06-04 NOTE — Assessment & Plan Note (Signed)
Chronic Check vitamin D level 

## 2023-06-04 NOTE — Assessment & Plan Note (Signed)
Chronic Regular exercise and healthy diet encouraged Check lipid panel, CMP, TSH Continue rosuvastatin 40 mg daily

## 2023-06-04 NOTE — Assessment & Plan Note (Addendum)
Chronic Following with cardiology BP well-controlled On amlodipine 10 mg daily, metoprolol 75 mg twice daily, spironolactone 12.5 mg daily, losartan 100 mg daily

## 2023-06-04 NOTE — Assessment & Plan Note (Signed)
Chronic Blood pressure well controlled CMP, CBC Continue amlodipine 10 mg daily, losartan 100 mg daily, metoprolol 75 mg twice daily, spironolactone 12.5 mg daily

## 2023-06-18 ENCOUNTER — Other Ambulatory Visit: Payer: Self-pay | Admitting: Internal Medicine

## 2023-06-30 ENCOUNTER — Other Ambulatory Visit: Payer: Self-pay | Admitting: Internal Medicine

## 2023-07-25 ENCOUNTER — Other Ambulatory Visit: Payer: Self-pay | Admitting: Cardiovascular Disease

## 2023-08-03 ENCOUNTER — Telehealth: Payer: Self-pay

## 2023-08-03 NOTE — Telephone Encounter (Signed)
   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 Dempsey Moan Surgeon's Group or Practice Name:  [facility] Phone number:  [phone] Fax number:  [phone]   Type of Clearance Requested:   - Medical    Type of Anesthesia:   Choice   Additional requests/questions:    Bonney Ival LOISE Gerome   08/03/2023, 3:10 PM

## 2023-08-05 ENCOUNTER — Telehealth: Payer: Self-pay | Admitting: *Deleted

## 2023-08-05 NOTE — Telephone Encounter (Signed)
 Dr. Arelia Sneddon has been scheduled tele preop appt 10/14/23. Med rec and consent are done.

## 2023-08-05 NOTE — Telephone Encounter (Signed)
 Primary Cardiologist:Thomas Burnard, MD   Preoperative team, please contact this patient and set up a phone call appointment for further preoperative risk assessment. Please obtain consent and complete medication review. Thank you for your help.   I confirm that guidance regarding antiplatelet and oral anticoagulation therapy has been completed and, if necessary, noted below (none requested).  I also confirmed the patient resides in the state of [place] . As per [facility] Medical Board telemedicine laws, the patient must reside in the state in which the provider is licensed.   Rosaline EMERSON Bane, NP-C  08/05/2023, 12:15 PM [address], [address] Office [phone] Fax [phone]

## 2023-08-05 NOTE — Telephone Encounter (Signed)
 Dr. Leva has been scheduled tele preop appt 10/14/23. Med rec and consent are done.     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   CONSENT FOR VIRTUAL VISIT FOR:  Juan Marlee Leva, MD  By participating in this virtual visit I agree to the following:  I hereby voluntarily request, consent and authorize [facility] and its employed or contracted physicians, physician assistants, nurse practitioners or other licensed health care professionals (the Practitioner), to provide me with telemedicine health care services (the "Services) as deemed necessary by the treating Practitioner. I acknowledge and consent to receive the Services by the Practitioner via telemedicine. I understand that the telemedicine visit will involve communicating with the Practitioner through live audiovisual communication technology and the disclosure of certain medical information by electronic transmission. I acknowledge that I have been given the opportunity to request an in-person assessment or other available alternative prior to the telemedicine visit and am voluntarily participating in the telemedicine visit.  I understand that I have the right to withhold or withdraw my consent to the use of telemedicine in the course of my care at any time, without affecting my right to future care or treatment, and that the Practitioner or I may terminate the telemedicine visit at any time. I understand that I have the right to inspect all information obtained and/or recorded in the course of the telemedicine visit and may receive copies of available information for a reasonable fee.  I understand that some of the potential risks of receiving the Services via telemedicine include:  Delay or interruption in medical evaluation due to technological equipment failure or disruption; Information transmitted may not be  sufficient (e.g. poor resolution of images) to allow for appropriate medical decision making by the Practitioner; and/or  In rare instances, security protocols could fail, causing a breach of personal health information.  Furthermore, I acknowledge that it is my responsibility to provide information about my medical history, conditions and care that is complete and accurate to the best of my ability. I acknowledge that Practitioner's advice, recommendations, and/or decision may be based on factors not within their control, such as incomplete or inaccurate data provided by me or distortions of diagnostic images or specimens that may result from electronic transmissions. I understand that the practice of medicine is not an exact science and that Practitioner makes no warranties or guarantees regarding treatment outcomes. I acknowledge that a copy of this consent can be made available to me via my patient portal [name]), or I can request a printed copy by calling the office of [facility].    I understand that my insurance will be billed for this visit.   I have read or had this consent read to me. I understand the contents of this consent, which adequately explains the benefits and risks of the Services being provided via telemedicine.  I have been provided ample opportunity to ask questions regarding this consent and the Services and have had my questions answered to my satisfaction. I give my informed consent for the services to be provided through the use of telemedicine in my medical care

## 2023-08-06 ENCOUNTER — Telehealth: Payer: Self-pay | Admitting: Internal Medicine

## 2023-08-06 NOTE — Telephone Encounter (Signed)
 We have received surgical clearance forms and they have been placed in the providers box.  Please fax to: [phone]

## 2023-08-12 NOTE — Telephone Encounter (Signed)
 Clearance and copy of labs faxed today.  Conformation received.

## 2023-08-14 ENCOUNTER — Other Ambulatory Visit: Payer: Self-pay | Admitting: Cardiovascular Disease

## 2023-09-18 ENCOUNTER — Other Ambulatory Visit: Payer: Self-pay | Admitting: Cardiovascular Disease

## 2023-10-14 ENCOUNTER — Ambulatory Visit: Payer: 59 | Attending: Cardiology

## 2023-10-14 DIAGNOSIS — Z01810 Encounter for preprocedural cardiovascular examination: Secondary | ICD-10-CM

## 2023-10-14 NOTE — Progress Notes (Signed)
 Virtual Visit via Telephone Note   Because of Juan Balding, Juan Coleman co-morbid illnesses, he is at least at moderate risk for complications without adequate follow up.  This format is felt to be most appropriate for this patient at this time.  Due to technical limitations with video connection (technology), today's appointment will be conducted as an audio only telehealth visit, and Juan Balding, Juan Coleman verbally agreed to proceed in this manner.   All issues noted in this document were discussed and addressed.  No physical exam could be performed with this format.  Evaluation Performed:  Preoperative cardiovascular risk assessment _____________   Date:  10/14/2023   Patient ID:  Juan Balding, Juan Coleman, DOB 08/01/1951, MRN [id number] Patient Location:  Home Provider location:   Office  Primary Care Provider:  Pincus Sanes, Juan Coleman Primary Cardiologist:  Nicki Guadalajara, Juan Coleman  Chief Complaint / Patient Profile   73 y.o. y/o male with a h/o essential hypertension, PVCs, hyperlipidemia who is pending left knee medial partial arthroplasty and presents today for telephonic preoperative cardiovascular risk assessment.  History of Present Illness    Juan Balding, Juan Coleman is a 73 y.o. male who presents via audio/video conferencing for a telehealth visit today.  Pt was last seen in cardiology clinic on 01/07/2023 by Bernadene Person NP-C.  At that time Juan Balding, Juan Coleman was doing well .  The patient is now pending procedure as outlined above. Since his last visit, he continues to be stable from a cardiac standpoint.  Today he denies chest pain, shortness of breath, lower extremity edema, fatigue, palpitations, melena, hematuria, hemoptysis, diaphoresis, weakness, presyncope, syncope, orthopnea, and PND.   Past Medical History    Past Medical History:  Diagnosis Date   Hyperlipidemia    Hypertension    Past Surgical History:  Procedure Laterality Date   APPENDECTOMY     COLONOSCOPY  2003  & 2015   Dr Matthias Hughs   LAPAROSCOPIC RETROPUBIC PROSTATECTOMY  2008   Dr Laverle Patter   UPPER GI ENDOSCOPY  2015   twice    Allergies  No Active Allergies  Home Medications    Prior to Admission medications   Medication Sig Start Date End Date Taking? Authorizing Provider  rosuvastatin (CRESTOR) 40 MG tablet TAKE 1 TABLET BY MOUTH EVERY DAY 09/20/23   Lennette Bihari, Juan Coleman  albuterol (VENTOLIN HFA) 108 (90 Base) MCG/ACT inhaler Inhale 2 puffs into the lungs every 4 (four) hours. 08/31/22   Provider, Historical, Juan Coleman  amLODipine (NORVASC) 10 MG tablet TAKE 1 TABLET BY MOUTH EVERY DAY 06/18/23   Pincus Sanes, Juan Coleman  losartan (COZAAR) 100 MG tablet TAKE 1 TABLET BY MOUTH EVERY DAY 06/18/23   Pincus Sanes, Juan Coleman  metoprolol tartrate (LOPRESSOR) 50 MG tablet TAKE 1 & 1/2 TABLETS (75 MG TOTAL) BY MOUTH 2 TIMES DAILY. 08/16/23   Lennette Bihari, Juan Coleman  mirabegron ER (MYRBETRIQ) 25 MG TB24 tablet Take 25 mg by mouth daily.    Provider, Historical, Juan Coleman  sertraline (ZOLOFT) 50 MG tablet TAKE 1 TABLET BY MOUTH EVERY DAY 06/30/23   Pincus Sanes, Juan Coleman  spironolactone (ALDACTONE) 25 MG tablet TAKE 1/2 TABLET BY MOUTH DAILY 07/26/23   Lennette Bihari, Juan Coleman  traZODone (DESYREL) 50 MG tablet TAKE 1-2 TABLETS BY MOUTH AT BEDTIME. 03/22/23   Pincus Sanes, Juan Coleman    Physical Exam    Vital Signs:  Juan Balding, Juan Coleman does not have vital signs available for review today.  Given  telephonic nature of communication, physical exam is limited. AAOx3. NAD. Normal affect.  Speech and respirations are unlabored.  Accessory Clinical Findings    None  Assessment & Plan    1.  Preoperative Cardiovascular Risk Assessment:Procedure:   Left Knee Medial Partial Arthroplasty   Date of Surgery:  Clearance 11/02/23                                  Surgeon:  Dr. Ollen Gross Surgeon's Group or Practice Name:  [facility] Phone number:  [phone] Fax number:  [phone]    Primary Cardiologist: Nicki Guadalajara, Juan Coleman  Chart  reviewed as part of pre-operative protocol coverage. Given past medical history and time since last visit, based on ACC/AHA guidelines, Juan Balding, Juan Coleman would be at acceptable risk for the planned procedure without further cardiovascular testing.   His RCRI is moderate risk, 6.6% risk of major cardiac event.  He is able to complete greater than 4 METS of physical activity.  Patient was advised that if he develops new symptoms prior to surgery to contact our office to arrange a follow-up appointment.  He verbalized understanding.  I will route this recommendation to the requesting party via Epic fax function and remove from pre-op pool.      Time:   Today, I have spent  5 minutes with the patient with telehealth technology discussing medical history, symptoms, and management plan.     Ronney Asters, NP  10/14/2023, 7:49 AM

## 2023-10-27 ENCOUNTER — Telehealth: Payer: Self-pay | Admitting: Internal Medicine

## 2023-10-27 NOTE — Telephone Encounter (Signed)
 Copied from CRM [id number]. Topic: General - Other >> Oct 27, 2023 11:03 AM [place] E wrote: Reason for CRM: Lawson Fiscal from [facility] called stating that the most recent labs they have for the patient are from November, and asking if  labs have been put in from the PA as she ordered them on 3/6, patient has an upcoming surgery on 4/1 and Lawson Fiscal just want clarification on this. Callback number for Lawson Fiscal is [phone] to discuss.

## 2023-10-28 NOTE — Telephone Encounter (Signed)
 LVM for Juan Coleman to return our call

## 2023-12-24 ENCOUNTER — Other Ambulatory Visit: Payer: Self-pay | Admitting: Cardiovascular Disease

## 2023-12-30 ENCOUNTER — Other Ambulatory Visit: Payer: Self-pay | Admitting: Cardiovascular Disease

## 2024-01-21 ENCOUNTER — Other Ambulatory Visit: Payer: Self-pay | Admitting: Cardiovascular Disease

## 2024-01-25 ENCOUNTER — Other Ambulatory Visit: Payer: Self-pay | Admitting: Cardiovascular Disease

## 2024-03-12 ENCOUNTER — Other Ambulatory Visit: Payer: Self-pay | Admitting: Internal Medicine

## 2024-03-27 ENCOUNTER — Other Ambulatory Visit: Payer: Self-pay

## 2024-03-27 MED ORDER — SPIRONOLACTONE 25 MG PO TABS: 12.5000 mg | ORAL_TABLET | Freq: Every day | ORAL | 0 refills | Status: DC

## 2024-04-20 LAB — HM COLONOSCOPY

## 2024-05-01 ENCOUNTER — Other Ambulatory Visit: Payer: Self-pay | Admitting: Nurse Practitioner

## 2024-05-01 MED ORDER — METOPROLOL TARTRATE 50 MG PO TABS: ORAL_TABLET | ORAL | 0 refills | Status: DC

## 2024-05-23 ENCOUNTER — Other Ambulatory Visit: Payer: Self-pay | Admitting: Nurse Practitioner

## 2024-06-09 ENCOUNTER — Other Ambulatory Visit: Payer: Self-pay | Admitting: Nurse Practitioner

## 2024-06-10 ENCOUNTER — Other Ambulatory Visit: Payer: Self-pay | Admitting: Internal Medicine

## 2024-06-12 NOTE — Telephone Encounter (Signed)
 Former Pt of Dr. Burnard. Last seen by Damien Braver NP on 01/07/23. Passed 3rd attempt. Does Damien Braver NP want to refill? Please advise.

## 2024-06-14 MED ORDER — ROSUVASTATIN CALCIUM 40 MG PO TABS: 40.0000 mg | ORAL_TABLET | Freq: Every day | ORAL | 1 refills | Status: DC

## 2024-06-15 ENCOUNTER — Telehealth (HOSPITAL_COMMUNITY): Payer: Self-pay | Admitting: Cardiology

## 2024-06-15 NOTE — Telephone Encounter (Signed)
 Patient left VM on HF triage line requesting refill on crestor  and spiro  Chart reviewed [facility] patient and refill addressed 11/12

## 2024-08-05 ENCOUNTER — Other Ambulatory Visit: Payer: Self-pay | Admitting: Nurse Practitioner

## 2024-08-14 ENCOUNTER — Other Ambulatory Visit: Payer: Self-pay | Admitting: Nurse Practitioner

## 2024-08-24 ENCOUNTER — Encounter: Payer: Self-pay | Admitting: Internal Medicine

## 2024-08-24 NOTE — Progress Notes (Signed)
 "   Subjective:    Patient ID: Juan Marlee Skill, MD, male    DOB: Jan 04, 1951, 74 y.o.   MRN: [id number]     HPI Juan Coleman is here for a physical exam and his chronic medical problems.   Doing well - no concerns.     Medications and allergies reviewed with patient and updated if appropriate.  Medications Ordered Prior to Encounter[1]  Review of Systems  Constitutional:  Negative for fever.  Eyes:  Negative for visual disturbance.  Respiratory:  Negative for cough, shortness of breath and wheezing.   Cardiovascular:  Negative for chest pain, palpitations and leg swelling.  Gastrointestinal:  Negative for abdominal pain, blood in stool, constipation and diarrhea.       No gerd  Genitourinary:  Negative for difficulty urinating, dysuria and hematuria.  Musculoskeletal:  Negative for arthralgias and back pain.  Skin:  Negative for rash.  Neurological:  Negative for light-headedness and headaches.  Psychiatric/Behavioral:  Negative for dysphoric mood. The patient is not nervous/anxious.        Objective:   Vitals:   08/25/24 0827  BP: 128/74  Pulse: 63  Temp: 98.4 F (36.9 C)  SpO2: 95%   Filed Weights   08/25/24 0827  Weight: 232 lb (105.2 kg)   Body mass index is 33.29 kg/m.  BP Readings from Last 3 Encounters:  08/25/24 128/74  06/04/23 130/74  01/07/23 130/70    Wt Readings from Last 3 Encounters:  08/25/24 232 lb (105.2 kg)  06/04/23 234 lb (106.1 kg)  01/07/23 238 lb 6.4 oz (108.1 kg)      Physical Exam Constitutional: He appears well-developed and well-nourished. No distress.  HENT:  Head: Normocephalic and atraumatic.  Right Ear: External ear normal.  Left Ear: External ear normal.  Normal ear canals and TM b/l  Mouth/Throat: Oropharynx is clear and moist. Eyes: Conjunctivae and EOM are normal.  Neck: Neck supple. No tracheal deviation present. No thyromegaly present.  No carotid bruit  Cardiovascular: Normal rate, regular rhythm, normal heart  sounds and intact distal pulses.   No murmur heard.  No lower extremity edema. Pulmonary/Chest: Effort normal and breath sounds normal. No respiratory distress. He has no wheezes. He has no rales.  Abdominal: Soft. He exhibits no distension. There is no tenderness.  Genitourinary: deferred  Lymphadenopathy:   He has no cervical adenopathy.  Skin: Skin is warm and dry. He is not diaphoretic.  Psychiatric: He has a normal mood and affect. His behavior is normal.         Assessment & Plan:   Physical exam: Screening blood work  ordered Exercise   regular Weight  obese Substance abuse   none   Reviewed recommended immunizations.   Health Maintenance  Topic Date Due   Zoster Vaccines- Shingrix (1 of 2) 08/29/2002   DTaP/Tdap/Td (2 - Td or Tdap) 08/03/2021   Influenza Vaccine  03/03/2024   COVID-19 Vaccine (7 - 2025-26 season) 09/09/2024 (Originally 04/03/2024)   Colonoscopy  04/21/2031   Pneumococcal Vaccine: 50+ Years  Completed   Hepatitis C Screening  Completed   Meningococcal B Vaccine  Aged Out     See Problem List for Assessment and Plan of chronic medical problems.      [1]  Current Outpatient Medications on File Prior to Visit  Medication Sig Dispense Refill   albuterol (VENTOLIN HFA) 108 (90 Base) MCG/ACT inhaler Inhale 2 puffs into the lungs every 4 (four) hours.     amLODipine  (NORVASC ) 10  MG tablet TAKE 1 TABLET BY MOUTH EVERY DAY 90 tablet 3   losartan  (COZAAR ) 100 MG tablet TAKE 1 TABLET BY MOUTH EVERY DAY 90 tablet 3   metoprolol  tartrate (LOPRESSOR ) 50 MG tablet TAKE 1 & 1/2 TABLETS BY MOUTH 2 TIMES DAILY. 45 tablet 0   mirabegron ER (MYRBETRIQ) 25 MG TB24 tablet Take 25 mg by mouth daily.     rosuvastatin  (CRESTOR ) 40 MG tablet TAKE 1 TABLET BY MOUTH EVERY DAY 90 tablet 1   sertraline  (ZOLOFT ) 50 MG tablet TAKE 1 TABLET BY MOUTH EVERY DAY 90 tablet 3   spironolactone  (ALDACTONE ) 25 MG tablet TAKE 1/2 TABLET BY MOUTH DAILY 30 tablet 1   traZODone   (DESYREL ) 50 MG tablet TAKE 1 TO 2 TABLETS BY MOUTH AT BEDTIME 180 tablet 3   No current facility-administered medications on file prior to visit.   "

## 2024-08-24 NOTE — Patient Instructions (Addendum)
 "     Medications changes include :   None     Return in about 1 year (around 08/25/2025) for Physical Exam.    Health Maintenance, Male Adopting a healthy lifestyle and getting preventive care are important in promoting health and wellness. Ask your health care provider about: The right schedule for you to have regular tests and exams. Things you can do on your own to prevent diseases and keep yourself healthy. What should I know about diet, weight, and exercise? Eat a healthy diet  Eat a diet that includes plenty of vegetables, fruits, low-fat dairy products, and lean protein. Do not eat a lot of foods that are high in solid fats, added sugars, or sodium. Maintain a healthy weight Body mass index (BMI) is a measurement that can be used to identify possible weight problems. It estimates body fat based on height and weight. Your health care provider can help determine your BMI and help you achieve or maintain a healthy weight. Get regular exercise Get regular exercise. This is one of the most important things you can do for your health. Most adults should: Exercise for at least 150 minutes each week. The exercise should increase your heart rate and make you sweat (moderate-intensity exercise). Do strengthening exercises at least twice a week. This is in addition to the moderate-intensity exercise. Spend less time sitting. Even light physical activity can be beneficial. Watch cholesterol and blood lipids Have your blood tested for lipids and cholesterol at 72 years of age, then have this test every 5 years. You may need to have your cholesterol levels checked more often if: Your lipid or cholesterol levels are high. You are older than 73 years of age. You are at high risk for heart disease. What should I know about cancer screening? Many types of cancers can be detected early and may often be prevented. Depending on your health history and family history, you may need to have cancer  screening at various ages. This may include screening for: Colorectal cancer. Prostate cancer. Skin cancer. Lung cancer. What should I know about heart disease, diabetes, and high blood pressure? Blood pressure and heart disease High blood pressure causes heart disease and increases the risk of stroke. This is more likely to develop in people who have high blood pressure readings or are overweight. Talk with your health care provider about your target blood pressure readings. Have your blood pressure checked: Every 3-5 years if you are 77-29 years of age. Every year if you are 13 years old or older. If you are between the ages of 45 and 6 and are a current or former smoker, ask your health care provider if you should have a one-time screening for abdominal aortic aneurysm (AAA). Diabetes Have regular diabetes screenings. This checks your fasting blood sugar level. Have the screening done: Once every three years after age 54 if you are at a normal weight and have a low risk for diabetes. More often and at a younger age if you are overweight or have a high risk for diabetes. What should I know about preventing infection? Hepatitis B If you have a higher risk for hepatitis B, you should be screened for this virus. Talk with your health care provider to find out if you are at risk for hepatitis B infection. Hepatitis C Blood testing is recommended for: Everyone born from [birth year] through 1963. Anyone with known risk factors for hepatitis C. Sexually transmitted infections (STIs) You should be screened each year  for STIs, including gonorrhea and chlamydia, if: You are sexually active and are younger than 72 years of age. You are older than 72 years of age and your health care provider tells you that you are at risk for this type of infection. Your sexual activity has changed since you were last screened, and you are at increased risk for chlamydia or gonorrhea. Ask your health care provider if  you are at risk. Ask your health care provider about whether you are at high risk for HIV. Your health care provider may recommend a prescription medicine to help prevent HIV infection. If you choose to take medicine to prevent HIV, you should first get tested for HIV. You should then be tested every 3 months for as long as you are taking the medicine. Follow these instructions at home: Alcohol use Do not drink alcohol if your health care provider tells you not to drink. If you drink alcohol: Limit how much you have to 0-2 drinks a day. Know how much alcohol is in your drink. In the [place] one drink equals one 12 oz bottle of beer (355 mL), one 5 oz glass of wine (148 mL), or one 1 oz glass of hard liquor (44 mL). Lifestyle Do not use any products that contain nicotine or tobacco. These products include cigarettes, chewing tobacco, and vaping devices, such as e-cigarettes. If you need help quitting, ask your health care provider. Do not use street drugs. Do not share needles. Ask your health care provider for help if you need support or information about quitting drugs. General instructions Schedule regular health, dental, and eye exams. Stay current with your vaccines. Tell your health care provider if: You often feel depressed. You have ever been abused or do not feel safe at home. Summary Adopting a healthy lifestyle and getting preventive care are important in promoting health and wellness. Follow your health care provider's instructions about healthy diet, exercising, and getting tested or screened for diseases. Follow your health care provider's instructions on monitoring your cholesterol and blood pressure. This information is not intended to replace advice given to you by your health care provider. Make sure you discuss any questions you have with your health care provider. Document Revised: 12/09/2020 Document Reviewed: 12/09/2020 Elsevier Patient Education  2024 [employer]. "

## 2024-08-25 ENCOUNTER — Ambulatory Visit: Admitting: Internal Medicine

## 2024-08-25 VITALS — BP 128/74 | HR 63 | Temp 98.4°F | Ht 70.0 in | Wt 232.0 lb

## 2024-08-25 DIAGNOSIS — Z0000 Encounter for general adult medical examination without abnormal findings: Secondary | ICD-10-CM

## 2024-08-25 DIAGNOSIS — R739 Hyperglycemia, unspecified: Secondary | ICD-10-CM

## 2024-08-25 DIAGNOSIS — G4709 Other insomnia: Secondary | ICD-10-CM | POA: Diagnosis not present

## 2024-08-25 DIAGNOSIS — R7303 Prediabetes: Secondary | ICD-10-CM

## 2024-08-25 DIAGNOSIS — E559 Vitamin D deficiency, unspecified: Secondary | ICD-10-CM

## 2024-08-25 DIAGNOSIS — E7800 Pure hypercholesterolemia, unspecified: Secondary | ICD-10-CM | POA: Diagnosis not present

## 2024-08-25 DIAGNOSIS — F419 Anxiety disorder, unspecified: Secondary | ICD-10-CM

## 2024-08-25 DIAGNOSIS — I10 Essential (primary) hypertension: Secondary | ICD-10-CM | POA: Diagnosis not present

## 2024-08-25 NOTE — Assessment & Plan Note (Signed)
 Chronic Blood pressure well controlled CMP, CBC Continue amlodipine 10 mg daily, losartan 100 mg daily, metoprolol 75 mg twice daily, spironolactone 12.5 mg daily

## 2024-08-25 NOTE — Assessment & Plan Note (Signed)
 Chronic Trazodone  50-100 mg at bedtime

## 2024-08-25 NOTE — Assessment & Plan Note (Signed)
 Chronic Regular exercise and healthy diet encouraged Check lipid panel, CMP, TSH Continue rosuvastatin 40 mg daily

## 2024-08-25 NOTE — Assessment & Plan Note (Signed)
Chronic Check vitamin D level 

## 2024-08-25 NOTE — Assessment & Plan Note (Signed)
 Chronic Controlled, Stable Continue sertraline 50 mg daily

## 2024-08-25 NOTE — Assessment & Plan Note (Signed)
 Chronic Lab Results  Component Value Date   HGBA1C 6.1 06/04/2023   Check a1c Low sugar / carb diet Stressed regular exercise
